# Patient Record
Sex: Female | Born: 1965
Health system: Southern US, Community
[De-identification: ages and names within clinical notes are randomized; demographics above are authoritative.]

## PROBLEM LIST (undated history)

## (undated) DIAGNOSIS — I4891 Unspecified atrial fibrillation: Secondary | ICD-10-CM

## (undated) DIAGNOSIS — T7840XA Allergy, unspecified, initial encounter: Secondary | ICD-10-CM

## (undated) DIAGNOSIS — Z808 Family history of malignant neoplasm of other organs or systems: Secondary | ICD-10-CM

## (undated) DIAGNOSIS — Z803 Family history of malignant neoplasm of breast: Secondary | ICD-10-CM

## (undated) DIAGNOSIS — E785 Hyperlipidemia, unspecified: Secondary | ICD-10-CM

## (undated) DIAGNOSIS — Z801 Family history of malignant neoplasm of trachea, bronchus and lung: Secondary | ICD-10-CM

## (undated) DIAGNOSIS — F419 Anxiety disorder, unspecified: Secondary | ICD-10-CM

## (undated) DIAGNOSIS — K219 Gastro-esophageal reflux disease without esophagitis: Secondary | ICD-10-CM

## (undated) HISTORY — DX: Hyperlipidemia, unspecified: E78.5

## (undated) HISTORY — DX: Family history of malignant neoplasm of other organs or systems: Z80.8

## (undated) HISTORY — PX: APPENDECTOMY: SHX54

## (undated) HISTORY — DX: Allergy, unspecified, initial encounter: T78.40XA

## (undated) HISTORY — DX: Gastro-esophageal reflux disease without esophagitis: K21.9

## (undated) HISTORY — DX: Family history of malignant neoplasm of breast: Z80.3

## (undated) HISTORY — PX: OTHER SURGICAL HISTORY: SHX169

## (undated) HISTORY — DX: Anxiety disorder, unspecified: F41.9

## (undated) HISTORY — PX: CHOLECYSTECTOMY: SHX55

## (undated) HISTORY — PX: WRIST SURGERY: SHX841

## (undated) HISTORY — DX: Family history of malignant neoplasm of trachea, bronchus and lung: Z80.1

## (undated) HISTORY — DX: Unspecified atrial fibrillation: I48.91

---

## 2003-07-14 ENCOUNTER — Other Ambulatory Visit: Admission: RE | Admit: 2003-07-14 | Discharge: 2003-07-14 | Payer: Self-pay | Admitting: Obstetrics and Gynecology

## 2003-09-15 ENCOUNTER — Inpatient Hospital Stay (HOSPITAL_COMMUNITY): Admission: EM | Admit: 2003-09-15 | Discharge: 2003-09-16 | Payer: Self-pay | Admitting: *Deleted

## 2004-01-27 ENCOUNTER — Inpatient Hospital Stay (HOSPITAL_COMMUNITY): Admission: RE | Admit: 2004-01-27 | Discharge: 2004-01-30 | Payer: Self-pay | Admitting: Obstetrics and Gynecology

## 2004-03-13 ENCOUNTER — Other Ambulatory Visit: Admission: RE | Admit: 2004-03-13 | Discharge: 2004-03-13 | Payer: Self-pay | Admitting: Obstetrics and Gynecology

## 2004-12-17 ENCOUNTER — Ambulatory Visit: Payer: Self-pay | Admitting: Family Medicine

## 2005-06-10 ENCOUNTER — Ambulatory Visit: Payer: Self-pay | Admitting: Family Medicine

## 2005-09-25 ENCOUNTER — Ambulatory Visit: Payer: Self-pay | Admitting: Family Medicine

## 2006-01-08 ENCOUNTER — Other Ambulatory Visit: Admission: RE | Admit: 2006-01-08 | Discharge: 2006-01-08 | Payer: Self-pay | Admitting: Obstetrics and Gynecology

## 2015-12-13 ENCOUNTER — Ambulatory Visit (INDEPENDENT_AMBULATORY_CARE_PROVIDER_SITE_OTHER): Payer: PRIVATE HEALTH INSURANCE | Admitting: Sports Medicine

## 2015-12-13 ENCOUNTER — Encounter: Payer: Self-pay | Admitting: Sports Medicine

## 2015-12-13 ENCOUNTER — Ambulatory Visit (INDEPENDENT_AMBULATORY_CARE_PROVIDER_SITE_OTHER): Payer: PRIVATE HEALTH INSURANCE

## 2015-12-13 DIAGNOSIS — M79673 Pain in unspecified foot: Secondary | ICD-10-CM

## 2015-12-13 DIAGNOSIS — M722 Plantar fascial fibromatosis: Secondary | ICD-10-CM | POA: Diagnosis not present

## 2015-12-13 MED ORDER — TRIAMCINOLONE ACETONIDE 10 MG/ML IJ SUSP: 10.0000 mg | Freq: Once | INTRAMUSCULAR | Status: DC

## 2015-12-13 MED ORDER — DICLOFENAC SODIUM 75 MG PO TBEC: 75.0000 mg | DELAYED_RELEASE_TABLET | Freq: Two times a day (BID) | ORAL | Status: DC

## 2015-12-13 NOTE — Progress Notes (Signed)
Patient ID: Brittany Jensen, female   DOB: 1966-06-25, 50 y.o.   MRN: [id number] Subjective: Brittany Jensen is a 50 y.o. female patient presents to office with complaint of heel pain on the left>right. Patient admits to post static dyskinesia for > 31months in duration. Patient has treated this problem with ice and ibuprofen with a little improvement. However states that on Sunday she worked flip-flops and inflamed the areas. 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 States that she desires to come here for her foot care. No other issues or concerns noted.   There are no active problems to display for this patient.   No current outpatient prescriptions on file prior to visit.   No current facility-administered medications on file prior to visit.    Allergies  Allergen Reactions  . Ancef [Cefazolin]   . Biaxin [Clarithromycin]   . Penicillins   . Wellbutrin [Bupropion]    Patient admits that she cannot take oral steroids because it gives her stomach upset. However, she has had Kenalog socks before in the past with no adverse reaction.   Objective: Physical Exam General: The patient is alert and oriented x3 in no acute distress.  Dermatology: Skin is warm, dry and supple bilateral lower extremities. Nails 1-10 are normal. There is no erythema, edema, no eccymosis, no open lesions present. Integument is otherwise unremarkable.  Vascular: Dorsalis Pedis pulse and Posterior Tibial pulse are 2/4 bilateral. Capillary fill time is immediate to all digits.  Neurological: Grossly intact to light touch with an achilles reflex of +2/5 and a negative Tinel's sign bilateral.  Musculoskeletal: Tenderness to palpation at the medial calcaneal tubercale and through the insertion of the plantar fascia on the left>right foot. No pain with compression of calcaneus  bilateral. No pain with tuning fork to calcaneus bilateral. No pain with calf compression bilateral. There is decreased Ankle joint range of motion bilateral. All other joints range of motion within normal limits bilateral. Strength 5/5 in all groups bilateral.   Xray, Right & Left foot:  Normal osseous mineralization. Joint spaces preserved. No fracture/dislocation/boney destruction. Mild hammertoe deformity and spur at the distal tuft hallux. + Significant posterior and inferior Calcaneal spur present with mild thickening of plantar fascia. No other soft tissue abnormalities or radiopaque foreign bodies.   Assessment and Plan: Problem List Items Addressed This Visit    None    Visit Diagnoses    Foot pain, unspecified laterality    -  Primary    Relevant Medications    triamcinolone acetonide (KENALOG) 10 MG/ML injection 10 mg    diclofenac (VOLTAREN) 75 MG EC tablet    Other Relevant Orders    DG Foot 2 Views Left    DG Foot 2 Views Right    Plantar fasciitis, bilateral        L>R    Relevant Medications    triamcinolone acetonide (KENALOG) 10 MG/ML injection 10 mg    diclofenac (VOLTAREN) 75 MG EC tablet      -Complete examination performed. Discussed with patient in detail the condition of plantar fasciitis, how this occurs and general treatment options. Explained both conservative and surgical treatments.  -After oral consent and aseptic prep, injected a mixture containing 1 ml of 2%  plain lidocaine, 1 ml 0.5% plain marcaine, 0.5 ml of kenalog 10 and 0.5 ml  of dexamethasone phosphate into left heel. Post-injection care discussed with patient.  -Rx Diclofenac -Recommended good supportive shoes and advised use of OTC insert. Explained to patient that if these orthoses work well, we will continue with these. If these do not improve her condition and  pain, we will consider custom molded orthoses. -Explained in detail the use of the fascial braces for both the left and right foot  which was dispensed at today's visit. -Explained and dispensed to patient daily stretching exercises. -Recommend patient to ice affected area 1-2x daily. -Patient to return to office in 3 weeks for follow up or sooner if problems or questions arise.  Landis Martins, DPM

## 2015-12-13 NOTE — Patient Instructions (Signed)

## 2015-12-13 NOTE — Progress Notes (Deleted)
   Subjective:    Patient ID: Brittany Jensen, female    DOB: 04/20/1966, 50 y.o.   MRN: [id number]  HPI    Review of Systems  Cardiovascular:       A-fib   All other systems reviewed and are negative.      Objective:   Physical Exam        Assessment & Plan:

## 2016-01-03 ENCOUNTER — Ambulatory Visit: Payer: PRIVATE HEALTH INSURANCE | Admitting: Sports Medicine

## 2016-10-17 ENCOUNTER — Other Ambulatory Visit: Payer: Self-pay | Admitting: Internal Medicine

## 2016-10-17 DIAGNOSIS — N644 Mastodynia: Secondary | ICD-10-CM

## 2017-04-27 DIAGNOSIS — I4891 Unspecified atrial fibrillation: Secondary | ICD-10-CM | POA: Diagnosis not present

## 2017-04-28 DIAGNOSIS — I4891 Unspecified atrial fibrillation: Secondary | ICD-10-CM | POA: Diagnosis not present

## 2017-05-02 ENCOUNTER — Ambulatory Visit (INDEPENDENT_AMBULATORY_CARE_PROVIDER_SITE_OTHER): Payer: Commercial Managed Care - PPO | Admitting: Cardiology

## 2017-05-02 ENCOUNTER — Encounter: Payer: Self-pay | Admitting: Cardiology

## 2017-05-02 VITALS — BP 128/82 | HR 95 | Ht 69.0 in | Wt 243.0 lb

## 2017-05-02 DIAGNOSIS — F17210 Nicotine dependence, cigarettes, uncomplicated: Secondary | ICD-10-CM | POA: Diagnosis not present

## 2017-05-02 DIAGNOSIS — I480 Paroxysmal atrial fibrillation: Secondary | ICD-10-CM | POA: Diagnosis not present

## 2017-05-02 HISTORY — DX: Nicotine dependence, cigarettes, uncomplicated: F17.210

## 2017-05-02 HISTORY — DX: Paroxysmal atrial fibrillation: I48.0

## 2017-05-02 MED ORDER — ASPIRIN EC 325 MG PO TBEC
325.0000 mg | DELAYED_RELEASE_TABLET | Freq: Every day | ORAL | 0 refills | Status: DC
Start: 2017-05-02 — End: 2017-06-02

## 2017-05-02 NOTE — Patient Instructions (Signed)
Medication Instructions:  Your physician has recommended you make the following change in your medication:   Start Aspirin 325 mg once daily Cut back Diltiazem (Cardizem) 30 mg to every 12 hours ( Twice Daily)     Labwork: None  Testing/Procedures: None   Follow-Up: Your physician recommends that you schedule a follow-up appointment in: Next week Thursday 23rd.    Any Other Special Instructions Will Be Listed Below (If Applicable).     If you need a refill on your cardiac medications before your next appointment, please call your pharmacy.

## 2017-05-02 NOTE — Progress Notes (Signed)
Cardiology Office Note:    Date:  05/02/2017   ID:  Brittany Jensen, DOB 1966/08/08, MRN [id number]  PCP:  Lowella Dandy, NP  Cardiologist:  Jenean Lindau, MD   Referring MD: Lowella Dandy, NP    ASSESSMENT:    1. Cigarette smoker   2. Paroxysmal atrial fibrillation (HCC)    PLAN:    In order of problems listed above:  1. I discussed with the patient atrial fibrillation, disease process. Management and therapy including rate and rhythm control, anticoagulation benefits and potential risks were discussed extensively with the patient. Patient had multiple questions which were answered to patient's satisfaction. Her chads score is 0 and she has been advised to take coated 325 mg of aspirin on a daily basis. 2. Her blood pressure is borderline and therefore I have asked her to cut down her Cardizem to 30 mg twice a day instead of 3 times a day. Hopefully it will help her blood pressure. I will asked her to keep herself well hydrated with salt and water. And she vocalized understanding. Sodium intake was increased in mild to moderate amounts. I also noted that her TSH was done at [facility] and was fine 3. Echocardiogram report was discussed with her at length. Her left atrial size is unremarkable. The left ventricular systolic function is preserved. 4. Weight reduction was stressed. I also counseled her against cigarette smoking and does so smoking explained and she verbalized understanding and she plans to quit. She will be seen in follow-up appointment next day or earlier if she has any concerns. She knows to go to the nearest emergency room for any concerning symptoms. Because of the fact that she feels weak when she exerts herself and the fact that and make modifications in her medication atorvastatin to keep off work until I see her next week. Total time for this evaluation was 40 minutes.   Medication Adjustments/Labs and Tests Ordered: Current medicines are reviewed at length with  the patient today.  Concerns regarding medicines are outlined above.  No orders of the defined types were placed in this encounter.  No orders of the defined types were placed in this encounter.    History of Present Illness:    Brittany Jensen is a 51 y.o. female who is being seen today for the evaluation of Paroxysmal atrial fibrillation at the request of Moon, Amy A, NP. Patient is a pleasant 51 year old female. She has past medical history of paroxysmal atrial fibrillation. She mentions to me that she is under significant stress because of her work and the fact that she has been under stress because her mother is in hospice. A few days ago patient noticed elevated heart rate and was not feeling well and went to [facility] and was admitted. I have reviewed the [facility] hospital records extensively. Subsequently she was treated for this and went into sinus rhythm. Since then she has done better. She however has some weakness on exertion. No chest pain orthopnea or PND. At the time of my evaluation she is alert awake oriented and in no distress. It appears that her blood pressure might be on the lower side at times because she is taking Cardizem.  History reviewed. No pertinent past medical history.  History reviewed. No pertinent surgical history.  Current Medications: Current Meds  Medication Sig  . ALPRAZolam (XANAX) 0.5 MG tablet Take 0.5 mg by mouth every 6 (six) hours as needed.  Marland Kitchen aspirin 81 MG chewable tablet  Chew 81 mg by mouth daily.  Marland Kitchen diltiazem (CARDIZEM) 30 MG tablet Take 30 mg by mouth 3 (three) times daily.  Marland Kitchen ibuprofen (ADVIL,MOTRIN) 800 MG tablet Take 800 mg by mouth every 8 (eight) hours as needed.  Marland Kitchen omeprazole (PRILOSEC) 40 MG capsule Take 40 mg by mouth daily.  . VENTOLIN HFA 108 (90 Base) MCG/ACT inhaler INHALE 2 (TWO) INHALATION EVERY FOUR-SIX HOURS AS NEEDED   Current Facility-Administered Medications for the 05/02/17 encounter (Office Visit) with Davide Risdon, Reita Cliche, MD  Medication  . triamcinolone acetonide (KENALOG) 10 MG/ML injection 10 mg     Allergies:   Ancef [cefazolin]; Biaxin [clarithromycin]; Penicillins; and Wellbutrin [bupropion]   Social History   Social History  . Marital status: Married    Spouse name: N/A  . Number of children: N/A  . Years of education: N/A   Social History Main Topics  . Smoking status: Current Every Day Smoker    Types: Cigarettes  . Smokeless tobacco: Never Used  . Alcohol use 0.0 oz/week     Comment: occ  . Drug use: No  . Sexual activity: Not Asked   Other Topics Concern  . None   Social History Narrative  . None     Family History: The patient's family history includes Atrial fibrillation in her father.  ROS:   Please see the history of present illness.    All other systems reviewed and are negative.  EKGs/Labs/Other Studies Reviewed:    The following studies were reviewed today: I reviewed the EKG and the [facility] hospital records extensively. Patient had multiple questions which were answered to her satisfaction.   Recent Labs: No results found for requested labs within last 8760 hours.  Recent Lipid Panel No results found for: CHOL, TRIG, HDL, CHOLHDL, VLDL, LDLCALC, LDLDIRECT  Physical Exam:    VS:  BP 128/82   Pulse 95   Ht 5\' 9"  (1.753 m)   Wt 243 lb (110.2 kg)   SpO2 99%   BMI 35.88 kg/m     Wt Readings from Last 3 Encounters:  05/02/17 243 lb (110.2 kg)     GEN: Patient is in no acute distress HEENT: Normal NECK: No JVD; No carotid bruits LYMPHATICS: No lymphadenopathy CARDIAC: S1 S2 regular, 2/6 systolic murmur at the apex. RESPIRATORY:  Clear to auscultation without rales, wheezing or rhonchi  ABDOMEN: Soft, non-tender, non-distended MUSCULOSKELETAL:  No edema; No deformity  SKIN: Warm and dry NEUROLOGIC:  Alert and oriented x 3 PSYCHIATRIC:  Normal affect    Signed, Jenean Lindau, MD  05/02/2017 10:50 AM    [place]

## 2017-05-08 ENCOUNTER — Ambulatory Visit (INDEPENDENT_AMBULATORY_CARE_PROVIDER_SITE_OTHER): Payer: Commercial Managed Care - PPO | Admitting: Cardiology

## 2017-05-08 ENCOUNTER — Encounter: Payer: Self-pay | Admitting: Cardiology

## 2017-05-08 VITALS — BP 130/78 | HR 75 | Ht 69.5 in | Wt 244.0 lb

## 2017-05-08 DIAGNOSIS — F17210 Nicotine dependence, cigarettes, uncomplicated: Secondary | ICD-10-CM

## 2017-05-08 DIAGNOSIS — I480 Paroxysmal atrial fibrillation: Secondary | ICD-10-CM | POA: Diagnosis not present

## 2017-05-08 NOTE — Progress Notes (Signed)
Cardiology Office Note:    Date:  05/08/2017   ID:  Brittany Jensen, DOB [birth year], MRN [id number]  PCP:  Lowella Dandy, NP  Cardiologist:  Jenean Lindau, MD   Referring MD: Lowella Dandy, NP    ASSESSMENT:    1. Paroxysmal atrial fibrillation (HCC)   2. Cigarette smoker    PLAN:    In order of problems listed above:  1. The patient is in stable sinus rhythm since last evaluation. She does not seem to be tolerating Cardizem too well. In view of this I would prefer to put her on a medication such as flecainide. I discussed this with her and extensive length and she is agreeable. For this she will undergo Lexiscan sestamibi at [place] in the next few days. If this is negative I we'll initiated him on flecainide 50 mg twice a day and follow her EKGs for the next few days. I plan to get back to work in the next few days after this evaluation and immunization. She localized understanding. She will be seen in follow-up appointment in 2 months or earlier if she has any concerns.   Medication Adjustments/Labs and Tests Ordered: Current medicines are reviewed at length with the patient today.  Concerns regarding medicines are outlined above.  Orders Placed This Encounter  Procedures  . Myocardial Perfusion Imaging   No orders of the defined types were placed in this encounter.    Chief Complaint  Patient presents with  . Follow-up    Medication Change      History of Present Illness:    Brittany Jensen is a 50 y.o. female. I recently evaluated her for paroxysmal atrial fibrillation. She is posthospitalization. I cut down her Cardizem dose and subsequently she is felt better but not completely fine. She also saw primary care physician recently and was initiated on antidepressant. She's been under a stressful situation with her mother being in hospice. She mentions to me that she feels better but not completely fine at this time. No chest pain orthopnea or PND. She has not had any  significant issues with palpitations at this time.  History reviewed. No pertinent past medical history.  History reviewed. No pertinent surgical history.  Current Medications: Current Meds  Medication Sig  . ALPRAZolam (XANAX) 0.5 MG tablet Take 0.5 mg by mouth every 6 (six) hours as needed.  Marland Kitchen aspirin EC 325 MG tablet Take 1 tablet (325 mg total) by mouth daily.  Marland Kitchen diltiazem (CARDIZEM) 30 MG tablet Take 30 mg by mouth every 12 (twelve) hours.  Marland Kitchen ibuprofen (ADVIL,MOTRIN) 800 MG tablet Take 800 mg by mouth every 8 (eight) hours as needed.  Marland Kitchen omeprazole (PRILOSEC) 40 MG capsule Take 40 mg by mouth daily.  Marland Kitchen venlafaxine (EFFEXOR) 75 MG tablet Take 75 mg by mouth 2 (two) times daily.  . VENTOLIN HFA 108 (90 Base) MCG/ACT inhaler INHALE 2 (TWO) INHALATION EVERY FOUR-SIX HOURS AS NEEDED   Current Facility-Administered Medications for the 05/08/17 encounter (Office Visit) with Luara Faye, Reita Cliche, MD  Medication  . triamcinolone acetonide (KENALOG) 10 MG/ML injection 10 mg     Allergies:   Ancef [cefazolin]; Biaxin [clarithromycin]; Penicillins; and Wellbutrin [bupropion]   Social History   Social History  . Marital status: Married    Spouse name: N/A  . Number of children: N/A  . Years of education: N/A   Social History Main Topics  . Smoking status: Current Every Day Smoker    Types: Cigarettes  .  Smokeless tobacco: Never Used  . Alcohol use 0.0 oz/week     Comment: occ  . Drug use: No  . Sexual activity: Not Asked   Other Topics Concern  . None   Social History Narrative  . None     Family History: The patient's family history includes Atrial fibrillation in her father.  ROS:   Please see the history of present illness.    All other systems reviewed and are negative.  EKGs/Labs/Other Studies Reviewed:    The following studies were reviewed today: I reviewed previous office notes and discussed this with her at length.   Recent Labs: No results found for  requested labs within last 8760 hours.  Recent Lipid Panel No results found for: CHOL, TRIG, HDL, CHOLHDL, VLDL, LDLCALC, LDLDIRECT  Physical Exam:    VS:  BP 130/78   Pulse 75   Ht 5' 9.5" (1.765 m)   Wt 244 lb 0.6 oz (110.7 kg)   SpO2 98%   BMI 35.52 kg/m     Wt Readings from Last 3 Encounters:  05/08/17 244 lb 0.6 oz (110.7 kg)  05/02/17 243 lb (110.2 kg)     GEN: Patient is in no acute distress HEENT: Normal NECK: No JVD; No carotid bruits LYMPHATICS: No lymphadenopathy CARDIAC: Hear sounds regular, 2/6 systolic murmur at the apex. RESPIRATORY:  Clear to auscultation without rales, wheezing or rhonchi  ABDOMEN: Soft, non-tender, non-distended MUSCULOSKELETAL:  No edema; No deformity  SKIN: Warm and dry NEUROLOGIC:  Alert and oriented x 3 PSYCHIATRIC:  Normal affect   Signed, Jenean Lindau, MD  05/08/2017 11:24 AM    [place]

## 2017-05-08 NOTE — Patient Instructions (Signed)
Medication Instructions:   Your physician recommends that you continue on your current medications as directed. Please refer to the Current Medication list given to you today.   Labwork: NONE  Testing/Procedures:  Your physician has requested that you have a lexiscan myoview. For further information please visit [email or web address]. Please follow instruction sheet, as given.    Follow-Up: Your physician recommends that you schedule a follow-up appointment in: 1 month with Dr. Geraldo Pitter.    Any Other Special Instructions Will Be Listed Below (If Applicable).     If you need a refill on your cardiac medications before your next appointment, please call your pharmacy.

## 2017-05-15 DIAGNOSIS — I480 Paroxysmal atrial fibrillation: Secondary | ICD-10-CM | POA: Diagnosis not present

## 2017-05-16 ENCOUNTER — Other Ambulatory Visit: Payer: Self-pay

## 2017-05-16 DIAGNOSIS — I480 Paroxysmal atrial fibrillation: Secondary | ICD-10-CM

## 2017-05-16 MED ORDER — FLECAINIDE ACETATE 50 MG PO TABS: 50.0000 mg | ORAL_TABLET | Freq: Two times a day (BID) | ORAL | 3 refills | Status: DC

## 2017-05-16 NOTE — Telephone Encounter (Signed)
Pt informed of results from [facility] and to start new medications Flecainide 50 mg BID and to start tomorrow. Also he would like for pt to have EKG and BMP done Tuesday of next week. Pt works with [employer] and is fine to have this done at work or could just with the results sent to Dr. Geraldo Pitter. Rx has been sent to pharmacy. Pt will stop Diltizem per RRR.

## 2017-05-20 ENCOUNTER — Ambulatory Visit (INDEPENDENT_AMBULATORY_CARE_PROVIDER_SITE_OTHER): Payer: Commercial Managed Care - PPO | Admitting: Cardiology

## 2017-05-20 DIAGNOSIS — I480 Paroxysmal atrial fibrillation: Secondary | ICD-10-CM | POA: Diagnosis not present

## 2017-05-20 NOTE — Progress Notes (Signed)
Pt Came in today for EKG and BMP. Dr. Geraldo Pitter has reviewed the EKG and states it looks fine. Pt has also been released to go back to work in Monday.

## 2017-05-21 LAB — BASIC METABOLIC PANEL
BUN / CREAT RATIO: 11 (ref 9–23)
BUN: 7 mg/dL (ref 6–24)
CO2: 25 mmol/L (ref 20–29)
CREATININE: 0.66 mg/dL (ref 0.57–1.00)
Calcium: 9.3 mg/dL (ref 8.7–10.2)
Chloride: 99 mmol/L (ref 96–106)
GFR, EST AFRICAN AMERICAN: 120 mL/min/{1.73_m2} (ref >59)
GFR, EST NON AFRICAN AMERICAN: 104 mL/min/{1.73_m2} (ref >59)
Glucose: 89 mg/dL (ref 65–99)
Potassium: 3.8 mmol/L (ref 3.5–5.2)
Sodium: 138 mmol/L (ref 134–144)

## 2017-05-27 ENCOUNTER — Other Ambulatory Visit: Payer: Self-pay

## 2017-05-27 ENCOUNTER — Telehealth: Payer: Self-pay | Admitting: Cardiology

## 2017-05-27 NOTE — Telephone Encounter (Signed)
Called Pharmacy and to why 90 days had not been dispensed to pt upon the refill request and Pharmacist stated that a mistake had been made and she would look into it to fix this. I advise her that if there is anything I could do to help to let us know. Brittany Jensen had been informed of this as well. I will check back with pharmacy to make sure this has been fixed.

## 2017-05-27 NOTE — Telephone Encounter (Signed)
Wants flecinide called to [facility] in [place] again because they only gave her quantity of 30 and she takes 2 a day

## 2017-06-02 ENCOUNTER — Other Ambulatory Visit: Payer: Self-pay | Admitting: Cardiology

## 2017-06-02 DIAGNOSIS — F17210 Nicotine dependence, cigarettes, uncomplicated: Secondary | ICD-10-CM

## 2017-06-02 DIAGNOSIS — I480 Paroxysmal atrial fibrillation: Secondary | ICD-10-CM

## 2017-06-03 MED ORDER — ASPIRIN EC 325 MG PO TBEC: 325.0000 mg | DELAYED_RELEASE_TABLET | Freq: Every day | ORAL | 3 refills | Status: DC

## 2017-06-09 ENCOUNTER — Ambulatory Visit: Payer: Commercial Managed Care - PPO | Admitting: Cardiology

## 2017-06-10 ENCOUNTER — Ambulatory Visit (INDEPENDENT_AMBULATORY_CARE_PROVIDER_SITE_OTHER): Payer: Commercial Managed Care - PPO | Admitting: Cardiology

## 2017-06-10 ENCOUNTER — Encounter: Payer: Self-pay | Admitting: Cardiology

## 2017-06-10 VITALS — BP 116/84 | HR 70 | Ht 69.0 in | Wt 245.8 lb

## 2017-06-10 DIAGNOSIS — F17210 Nicotine dependence, cigarettes, uncomplicated: Secondary | ICD-10-CM

## 2017-06-10 DIAGNOSIS — I480 Paroxysmal atrial fibrillation: Secondary | ICD-10-CM

## 2017-06-10 NOTE — Patient Instructions (Signed)
Medication Instructions:   Your physician recommends that you continue on your current medications as directed. Please refer to the Current Medication list given to you today.   Labwork:  NONE  Testing/Procedures:  NONE  Follow-Up:  Your physician recommends that you schedule a follow-up appointment in: 3 months with Dr. Geraldo Pitter   Any Other Special Instructions Will Be Listed Below (If Applicable).     If you need a refill on your cardiac medications before your next appointment, please call your pharmacy.

## 2017-06-11 NOTE — Progress Notes (Signed)
Cardiology Office Note:    Date:  06/11/2017   ID:  Brittany Jensen, DOB 1965-10-11, MRN [id number]  PCP:  Brittany Dandy, NP  Cardiologist:  Brittany Lindau, MD   Referring MD: Brittany Dandy, NP    ASSESSMENT:    1. Paroxysmal atrial fibrillation (HCC)   2. Cigarette smoker    PLAN:    In order of problems listed above:  1. Atrial fibrillation issues and rhythm issues are stable at this time. She is clinically in sinus rhythm. I encouraged her to keep her stress level at low especially with her situation at home and she verbalized understanding. She will continue current medications. She'll be seen in follow-up appointment in 3 months or earlier if she has any concerns. 2. I encouraged her again cut down her smoking and quit as soon as possible she said she is working on it.   Medication Adjustments/Labs and Tests Ordered: Current medicines are reviewed at length with the patient today.  Concerns regarding medicines are outlined above.  No orders of the defined types were placed in this encounter.  No orders of the defined types were placed in this encounter.    Chief Complaint  Patient presents with  . Follow-up     History of Present Illness:    Brittany Jensen is a 51 y.o. female . She recently has been diagnosed with approximately fibrillation. She denies any problems at this time and takes care of activities of daily living. No chest pain orthopnea or PND. Her mother is now in hospice and she is just about this. She's not had any palpitations or any such issues. At the time of my evaluation she is alert awake oriented and in no distress.  History reviewed. No pertinent past medical history.  History reviewed. No pertinent surgical history.  Current Medications: Current Meds  Medication Sig  . ALPRAZolam (XANAX) 0.5 MG tablet Take 0.5 mg by mouth every 6 (six) hours as needed.  Marland Kitchen aspirin EC 325 MG tablet Take 1 tablet (325 mg total) by mouth daily.  . flecainide  (TAMBOCOR) 50 MG tablet Take 1 tablet (50 mg total) by mouth 2 (two) times daily.  Marland Kitchen ibuprofen (ADVIL,MOTRIN) 800 MG tablet Take 800 mg by mouth every 8 (eight) hours as needed.  Marland Kitchen omeprazole (PRILOSEC) 40 MG capsule Take 40 mg by mouth daily.  Marland Kitchen venlafaxine (EFFEXOR) 75 MG tablet Take 75 mg by mouth 2 (two) times daily.  . VENTOLIN HFA 108 (90 Base) MCG/ACT inhaler INHALE 2 (TWO) INHALATION EVERY FOUR-SIX HOURS AS NEEDED   Current Facility-Administered Medications for the 06/10/17 encounter (Office Visit) with Brittany Jensen, Reita Cliche, MD  Medication  . triamcinolone acetonide (KENALOG) 10 MG/ML injection 10 mg     Allergies:   Ancef [cefazolin]; Biaxin [clarithromycin]; Penicillins; and Wellbutrin [bupropion]   Social History   Social History  . Marital status: Married    Spouse name: N/A  . Number of children: N/A  . Years of education: N/A   Social History Main Topics  . Smoking status: Current Every Day Smoker    Types: Cigarettes  . Smokeless tobacco: Never Used  . Alcohol use 0.0 oz/week     Comment: occ  . Drug use: No  . Sexual activity: Not Asked   Other Topics Concern  . None   Social History Narrative  . None     Family History: The patient's family history includes Atrial fibrillation in her father.  ROS:   Please  see the history of present illness.    All other systems reviewed and are negative.  EKGs/Labs/Other Studies Reviewed:    The following studies were reviewed today: Prior office visit records were reviewed and discussed with her.   Recent Labs: 05/20/2017: BUN 7; Creatinine, Ser 0.66; Potassium 3.8; Sodium 138  Recent Lipid Panel No results found for: CHOL, TRIG, HDL, CHOLHDL, VLDL, LDLCALC, LDLDIRECT  Physical Exam:    VS:  BP 116/84   Pulse 70   Ht 5\' 9"  (1.753 m)   Wt 245 lb 12.8 oz (111.5 kg)   SpO2 96%   BMI 36.30 kg/m     Wt Readings from Last 3 Encounters:  06/10/17 245 lb 12.8 oz (111.5 kg)  05/08/17 244 lb 0.6 oz (110.7 kg)    05/02/17 243 lb (110.2 kg)     GEN: Patient is in no acute distress HEENT: Normal NECK: No JVD; No carotid bruits LYMPHATICS: No lymphadenopathy CARDIAC: Hear sounds regular, 2/6 systolic murmur at the apex. RESPIRATORY:  Clear to auscultation without rales, wheezing or rhonchi  ABDOMEN: Soft, non-tender, non-distended MUSCULOSKELETAL:  No edema; No deformity  SKIN: Warm and dry NEUROLOGIC:  Alert and oriented x 3 PSYCHIATRIC:  Normal affect   Signed, Brittany Lindau, MD  06/11/2017 8:48 AM    [place]

## 2017-06-25 ENCOUNTER — Encounter: Payer: Self-pay | Admitting: Cardiology

## 2017-06-25 ENCOUNTER — Other Ambulatory Visit: Payer: Self-pay

## 2017-06-25 DIAGNOSIS — I480 Paroxysmal atrial fibrillation: Secondary | ICD-10-CM

## 2017-06-25 MED ORDER — FLECAINIDE ACETATE 50 MG PO TABS: 50.0000 mg | ORAL_TABLET | Freq: Two times a day (BID) | ORAL | 3 refills | Status: DC

## 2017-09-05 ENCOUNTER — Encounter: Payer: Self-pay | Admitting: Cardiology

## 2017-09-05 ENCOUNTER — Ambulatory Visit (INDEPENDENT_AMBULATORY_CARE_PROVIDER_SITE_OTHER): Payer: Commercial Managed Care - PPO | Admitting: Cardiology

## 2017-09-05 VITALS — BP 138/90 | HR 85 | Ht 69.0 in | Wt 254.0 lb

## 2017-09-05 DIAGNOSIS — I480 Paroxysmal atrial fibrillation: Secondary | ICD-10-CM | POA: Diagnosis not present

## 2017-09-05 DIAGNOSIS — F17210 Nicotine dependence, cigarettes, uncomplicated: Secondary | ICD-10-CM | POA: Diagnosis not present

## 2017-09-05 NOTE — Progress Notes (Signed)
Cardiology Office Note:    Date:  09/05/2017   ID:  Brittany Jensen, DOB March 02, 1966, MRN [id number]  PCP:  Brittany Dandy, NP  Cardiologist:  Jenean Lindau, MD   Referring MD: Brittany Dandy, NP    ASSESSMENT:    1. Paroxysmal atrial fibrillation (HCC)   2. Cigarette smoker    PLAN:    In order of problems listed above:  1. I discussed with the patient atrial fibrillation, disease process. Management and therapy including rate and rhythm control, anticoagulation benefits and potential risks were discussed extensively with the patient. Patient had multiple questions which were answered to patient's satisfaction.  Patient's chads vasc score is low and she will continue full-strength coated aspirin.  I counseled her against smoking and she vocalized understanding plans to quit.  Importance of regular exercise and weight reduction with diet and exercise was stressed.  She will be seen in follow-up appointment in August or earlier if she has any concerns.   Medication Adjustments/Labs and Tests Ordered: Current medicines are reviewed at length with the patient today.  Concerns regarding medicines are outlined above.  No orders of the defined types were placed in this encounter.  No orders of the defined types were placed in this encounter.    Chief Complaint  Patient presents with  . Follow-up     History of Present Illness:    Brittany Jensen is a 51 y.o. female.  The patient has paroxysmal atrial fibrillation and is doing well on flecainide.  He denies any problems at this time no chest pain orthopnea or PND.  She leads a sedentary lifestyle because of orthopedic issues with her back.  Unfortunately she continues to smoke.  At the time of my evaluation, the patient is alert awake oriented and in no distress.  History reviewed. No pertinent past medical history.  History reviewed. No pertinent surgical history.  Current Medications: Current Meds  Medication Sig  . ALPRAZolam  (XANAX) 0.5 MG tablet Take 0.5 mg by mouth every 6 (six) hours as needed.  Marland Kitchen aspirin EC 325 MG tablet Take 1 tablet (325 mg total) by mouth daily.  . flecainide (TAMBOCOR) 50 MG tablet Take 1 tablet (50 mg total) by mouth 2 (two) times daily.  Marland Kitchen ibuprofen (ADVIL,MOTRIN) 800 MG tablet Take 800 mg by mouth every 8 (eight) hours as needed.  Marland Kitchen omeprazole (PRILOSEC) 40 MG capsule Take 40 mg by mouth daily.   Current Facility-Administered Medications for the 09/05/17 encounter (Office Visit) with Kymberlyn Eckford, Reita Cliche, MD  Medication  . triamcinolone acetonide (KENALOG) 10 MG/ML injection 10 mg     Allergies:   Ancef [cefazolin]; Biaxin [clarithromycin]; Penicillins; and Wellbutrin [bupropion]   Social History   Socioeconomic History  . Marital status: Married    Spouse name: None  . Number of children: None  . Years of education: None  . Highest education level: None  Social Needs  . Financial resource strain: None  . Food insecurity - worry: None  . Food insecurity - inability: None  . Transportation needs - medical: None  . Transportation needs - non-medical: None  Occupational History  . None  Tobacco Use  . Smoking status: Current Every Day Smoker    Types: Cigarettes  . Smokeless tobacco: Never Used  Substance and Sexual Activity  . Alcohol use: Yes    Alcohol/week: 0.0 oz    Comment: occ  . Drug use: No  . Sexual activity: None  Other Topics Concern  .  None  Social History Narrative  . None     Family History: The patient's family history includes Atrial fibrillation in her father.  ROS:   Please see the history of present illness.    All other systems reviewed and are negative.  EKGs/Labs/Other Studies Reviewed:    The following studies were reviewed today: Discussed my findings with the patient at extensive length.  EKG done today reveals sinus rhythm and nonspecific ST changes.   Recent Labs: 05/20/2017: BUN 7; Creatinine, Ser 0.66; Potassium 3.8; Sodium 138    Recent Lipid Panel No results found for: CHOL, TRIG, HDL, CHOLHDL, VLDL, LDLCALC, LDLDIRECT  Physical Exam:    VS:  BP 138/90 (BP Location: Left Arm, Patient Position: Sitting, Cuff Size: Normal)   Pulse 85   Ht 5\' 9"  (1.753 m)   Wt 254 lb (115.2 kg)   SpO2 98%   BMI 37.51 kg/m     Wt Readings from Last 3 Encounters:  09/05/17 254 lb (115.2 kg)  06/10/17 245 lb 12.8 oz (111.5 kg)  05/08/17 244 lb 0.6 oz (110.7 kg)     GEN: Patient is in no acute distress HEENT: Normal NECK: No JVD; No carotid bruits LYMPHATICS: No lymphadenopathy CARDIAC: Hear sounds regular, 2/6 systolic murmur at the apex. RESPIRATORY:  Clear to auscultation without rales, wheezing or rhonchi  ABDOMEN: Soft, non-tender, non-distended MUSCULOSKELETAL:  No edema; No deformity  SKIN: Warm and dry NEUROLOGIC:  Alert and oriented x 3 PSYCHIATRIC:  Normal affect   Signed, Jenean Lindau, MD  09/05/2017 4:02 PM     [facility]

## 2017-09-05 NOTE — Patient Instructions (Signed)
Medication Instructions:  Your physician recommends that you continue on your current medications as directed. Please refer to the Current Medication list given to you today.  Labwork: None ordered  Testing/Procedures: None ordered  Follow-Up: Your physician recommends that you schedule a follow-up appointment in: 7 months with Dr. Agustin Cree  Any Other Special Instructions Will Be Listed Below (If Applicable).     If you need a refill on your cardiac medications before your next appointment, please call your pharmacy.

## 2017-09-10 ENCOUNTER — Other Ambulatory Visit (INDEPENDENT_AMBULATORY_CARE_PROVIDER_SITE_OTHER): Payer: Commercial Managed Care - PPO

## 2017-09-10 DIAGNOSIS — I480 Paroxysmal atrial fibrillation: Secondary | ICD-10-CM

## 2018-03-04 DIAGNOSIS — Z719 Counseling, unspecified: Secondary | ICD-10-CM | POA: Diagnosis not present

## 2018-04-01 DIAGNOSIS — Z719 Counseling, unspecified: Secondary | ICD-10-CM | POA: Diagnosis not present

## 2018-04-08 DIAGNOSIS — Z719 Counseling, unspecified: Secondary | ICD-10-CM | POA: Diagnosis not present

## 2018-05-08 DIAGNOSIS — Z1231 Encounter for screening mammogram for malignant neoplasm of breast: Secondary | ICD-10-CM | POA: Diagnosis not present

## 2018-05-26 ENCOUNTER — Other Ambulatory Visit: Payer: Self-pay | Admitting: Cardiology

## 2018-05-26 DIAGNOSIS — I480 Paroxysmal atrial fibrillation: Secondary | ICD-10-CM

## 2018-07-10 DIAGNOSIS — M7731 Calcaneal spur, right foot: Secondary | ICD-10-CM | POA: Diagnosis not present

## 2018-07-10 DIAGNOSIS — M79672 Pain in left foot: Secondary | ICD-10-CM | POA: Diagnosis not present

## 2018-07-10 DIAGNOSIS — M79671 Pain in right foot: Secondary | ICD-10-CM | POA: Diagnosis not present

## 2018-07-10 DIAGNOSIS — M71571 Other bursitis, not elsewhere classified, right ankle and foot: Secondary | ICD-10-CM | POA: Diagnosis not present

## 2018-07-10 DIAGNOSIS — M722 Plantar fascial fibromatosis: Secondary | ICD-10-CM | POA: Diagnosis not present

## 2018-08-28 ENCOUNTER — Other Ambulatory Visit: Payer: Self-pay | Admitting: *Deleted

## 2018-08-28 DIAGNOSIS — I480 Paroxysmal atrial fibrillation: Secondary | ICD-10-CM

## 2018-08-28 MED ORDER — FLECAINIDE ACETATE 50 MG PO TABS: 50.0000 mg | ORAL_TABLET | Freq: Two times a day (BID) | ORAL | 0 refills | Status: DC

## 2018-09-25 ENCOUNTER — Ambulatory Visit (INDEPENDENT_AMBULATORY_CARE_PROVIDER_SITE_OTHER): Payer: BLUE CROSS/BLUE SHIELD | Admitting: Cardiology

## 2018-09-25 ENCOUNTER — Encounter: Payer: Self-pay | Admitting: Cardiology

## 2018-09-25 VITALS — BP 122/70 | HR 75 | Ht 69.0 in | Wt 246.0 lb

## 2018-09-25 DIAGNOSIS — F17210 Nicotine dependence, cigarettes, uncomplicated: Secondary | ICD-10-CM

## 2018-09-25 DIAGNOSIS — I480 Paroxysmal atrial fibrillation: Secondary | ICD-10-CM

## 2018-09-25 MED ORDER — FLECAINIDE ACETATE 50 MG PO TABS: 50.0000 mg | ORAL_TABLET | Freq: Two times a day (BID) | ORAL | 1 refills | Status: DC

## 2018-09-25 NOTE — Progress Notes (Signed)
Cardiology Office Note:    Date:  09/25/2018   ID:  Brittany Jensen, DOB 1965/12/08, MRN [id number]  PCP:  Lowella Dandy, NP  Cardiologist:  Jenean Lindau, MD   Referring MD: Lowella Dandy, NP    ASSESSMENT:    1. Paroxysmal atrial fibrillation (HCC)   2. Cigarette smoker    PLAN:    In order of problems listed above:  1. Primary prevention stressed with the patient.  Importance of compliance with diet and medication stressed and she vocalized understanding.  Her blood pressure is stable.  Diet was discussed for dyslipidemia and obesity and risks of obesity explained and she vocalized understanding.  She is compliant with her medications and taking them regularly.  Importance of regular exercise stressed.Patient will be seen in follow-up appointment in 6 months or earlier if the patient has any concerns.  I cautioned her against smoking and she tells me that she is in the process of quitting.  Risks were discussed.   Medication Adjustments/Labs and Tests Ordered: Current medicines are reviewed at length with the patient today.  Concerns regarding medicines are outlined above.  No orders of the defined types were placed in this encounter.  No orders of the defined types were placed in this encounter.    No chief complaint on file.    History of Present Illness:    Brittany Jensen is a 53 y.o. female.  Patient has past medical history of paroxysmal atrial fibrillation.  She denies any problems at this time and takes care of activities of daily living.  No chest pain orthopnea or PND.  She leads a sedentary lifestyle and continues to smoke.  She has not had any palpitations or any such issues and is happy with her health at this time.  History reviewed. No pertinent past medical history.  History reviewed. No pertinent surgical history.  Current Medications: Current Meds  Medication Sig  . ALPRAZolam (XANAX) 0.5 MG tablet Take 0.5 mg by mouth every 6 (six) hours as needed.  .  flecainide (TAMBOCOR) 50 MG tablet Take 1 tablet (50 mg total) by mouth 2 (two) times daily.  Marland Kitchen ibuprofen (ADVIL,MOTRIN) 800 MG tablet Take 800 mg by mouth every 8 (eight) hours as needed.  Marland Kitchen omeprazole (PRILOSEC) 40 MG capsule Take 40 mg by mouth daily.   Current Facility-Administered Medications for the 09/25/18 encounter (Office Visit) with , Reita Cliche, MD  Medication  . triamcinolone acetonide (KENALOG) 10 MG/ML injection 10 mg     Allergies:   Ancef [cefazolin]; Biaxin [clarithromycin]; Penicillins; and Wellbutrin [bupropion]   Social History   Socioeconomic History  . Marital status: Married    Spouse name: Not on file  . Number of children: Not on file  . Years of education: Not on file  . Highest education level: Not on file  Occupational History  . Not on file  Social Needs  . Financial resource strain: Not on file  . Food insecurity:    Worry: Not on file    Inability: Not on file  . Transportation needs:    Medical: Not on file    Non-medical: Not on file  Tobacco Use  . Smoking status: Current Every Day Smoker    Types: Cigarettes  . Smokeless tobacco: Never Used  Substance and Sexual Activity  . Alcohol use: Yes    Alcohol/week: 0.0 standard drinks    Comment: occ  . Drug use: No  . Sexual activity: Not on file  Lifestyle  . Physical activity:    Days per week: Not on file    Minutes per session: Not on file  . Stress: Not on file  Relationships  . Social connections:    Talks on phone: Not on file    Gets together: Not on file    Attends religious service: Not on file    Active member of club or organization: Not on file    Attends meetings of clubs or organizations: Not on file    Relationship status: Not on file  Other Topics Concern  . Not on file  Social History Narrative  . Not on file     Family History: The patient's family history includes Atrial fibrillation in her father.  ROS:   Please see the history of present illness.      All other systems reviewed and are negative.  EKGs/Labs/Other Studies Reviewed:    The following studies were reviewed today: EKG reveals sinus rhythm and nonspecific ST-T changes   Recent Labs: No results found for requested labs within last 8760 hours.  Recent Lipid Panel No results found for: CHOL, TRIG, HDL, CHOLHDL, VLDL, LDLCALC, LDLDIRECT  Physical Exam:    VS:  BP 122/70 (BP Location: Right Arm, Patient Position: Sitting, Cuff Size: Normal)   Pulse 75   Ht 5\' 9"  (1.753 m)   Wt 246 lb (111.6 kg)   SpO2 98%   BMI 36.33 kg/m     Wt Readings from Last 3 Encounters:  09/25/18 246 lb (111.6 kg)  09/05/17 254 lb (115.2 kg)  06/10/17 245 lb 12.8 oz (111.5 kg)     GEN: Patient is in no acute distress HEENT: Normal NECK: No JVD; No carotid bruits LYMPHATICS: No lymphadenopathy CARDIAC: Hear sounds regular, 2/6 systolic murmur at the apex. RESPIRATORY:  Clear to auscultation without rales, wheezing or rhonchi  ABDOMEN: Soft, non-tender, non-distended MUSCULOSKELETAL:  No edema; No deformity  SKIN: Warm and dry NEUROLOGIC:  Alert and oriented x 3 PSYCHIATRIC:  Normal affect   Signed, Jenean Lindau, MD  09/25/2018 2:29 PM    [place]

## 2018-09-25 NOTE — Patient Instructions (Signed)
Medication Instructions:  Your physician recommends that you continue on your current medications as directed. Please refer to the Current Medication list given to you today.  If you need a refill on your cardiac medications before your next appointment, please call your pharmacy.   Lab work: None.  If you have labs (blood work) drawn today and your tests are completely normal, you will receive your results only by: Marland Kitchen MyChart Message (if you have MyChart) OR . A paper copy in the mail If you have any lab test that is abnormal or we need to change your treatment, we will call you to review the results.  Testing/Procedures: None.   Follow-Up: At [facility], you and your health needs are our priority.  As part of our continuing mission to provide you with exceptional heart care, we have created designated Provider Care Teams.  These Care Teams include your primary Cardiologist (physician) and Advanced Practice Providers (APPs -  Physician Assistants and Nurse Practitioners) who all work together to provide you with the care you need, when you need it. You will need a follow up appointment in 9 months.  Please call our office 2 months in advance to schedule this appointment.  You may see No primary care provider on file. or another member of our [employer] in [place]: Jenne Campus, MD . Shirlee More, MD  Any Other Special Instructions Will Be Listed Below (If Applicable).

## 2018-11-17 DIAGNOSIS — J209 Acute bronchitis, unspecified: Secondary | ICD-10-CM | POA: Diagnosis not present

## 2018-11-17 DIAGNOSIS — Z6835 Body mass index (BMI) 35.0-35.9, adult: Secondary | ICD-10-CM | POA: Diagnosis not present

## 2018-11-20 DIAGNOSIS — E669 Obesity, unspecified: Secondary | ICD-10-CM | POA: Diagnosis not present

## 2018-11-20 DIAGNOSIS — Z0000 Encounter for general adult medical examination without abnormal findings: Secondary | ICD-10-CM | POA: Diagnosis not present

## 2018-11-20 DIAGNOSIS — E559 Vitamin D deficiency, unspecified: Secondary | ICD-10-CM | POA: Diagnosis not present

## 2018-11-20 DIAGNOSIS — Z720 Tobacco use: Secondary | ICD-10-CM | POA: Diagnosis not present

## 2018-11-25 ENCOUNTER — Encounter: Payer: Self-pay | Admitting: Gastroenterology

## 2019-01-01 ENCOUNTER — Encounter: Payer: BLUE CROSS/BLUE SHIELD | Admitting: Gastroenterology

## 2019-03-22 ENCOUNTER — Other Ambulatory Visit: Payer: Self-pay | Admitting: Cardiology

## 2019-03-22 DIAGNOSIS — I480 Paroxysmal atrial fibrillation: Secondary | ICD-10-CM

## 2019-03-24 NOTE — Telephone Encounter (Signed)
Flecainide refill sent to [facility] in [place]

## 2019-03-26 ENCOUNTER — Other Ambulatory Visit: Payer: Self-pay

## 2019-03-26 ENCOUNTER — Telehealth: Payer: BLUE CROSS/BLUE SHIELD | Admitting: Cardiology

## 2019-03-26 ENCOUNTER — Encounter: Payer: Self-pay | Admitting: Cardiology

## 2019-03-26 ENCOUNTER — Telehealth (INDEPENDENT_AMBULATORY_CARE_PROVIDER_SITE_OTHER): Payer: BC Managed Care – PPO | Admitting: Cardiology

## 2019-03-26 VITALS — Ht 69.0 in | Wt 240.0 lb

## 2019-03-26 DIAGNOSIS — I480 Paroxysmal atrial fibrillation: Secondary | ICD-10-CM

## 2019-03-26 DIAGNOSIS — F17210 Nicotine dependence, cigarettes, uncomplicated: Secondary | ICD-10-CM

## 2019-03-26 NOTE — Progress Notes (Signed)
Virtual Visit via Video Note   This visit type was conducted due to national recommendations for restrictions regarding the COVID-19 Pandemic (e.g. social distancing) in an effort to limit this patient's exposure and mitigate transmission in our community.  Due to her co-morbid illnesses, this patient is at least at moderate risk for complications without adequate follow up.  This format is felt to be most appropriate for this patient at this time.  All issues noted in this document were discussed and addressed.  A limited physical exam was performed with this format.  Please refer to  chart for her consent to telehealth for [facility].   Date:  03/26/2019   ID:  Brittany Jensen, DOB [birth year], MRN [id number]  Patient Location: Home Provider Location: Office  PCP:  Lowella Dandy, NP  Cardiologist:  No primary care provider on file.  Electrophysiologist:  None   Evaluation Performed:  Follow-Up Visit  Chief Complaint: Paroxysmal atrial fibrillation  History of Present Illness:    Brittany Jensen is a 53 y.o. female with past medical history of paroxysmal atrial fibrillation.  She denies any problems at this time and takes care of activities of daily living.  No chest pain orthopnea or PND.  She has not had any palpitations or any such issues.  At the time of my evaluation, the patient is alert awake oriented and in no distress.  The patient does not have symptoms concerning for COVID-19 infection (fever, chills, cough, or new shortness of breath).    History reviewed. No pertinent past medical history. History reviewed. No pertinent surgical history.   Current Meds  Medication Sig  . ALPRAZolam (XANAX) 0.5 MG tablet Take 0.5 mg by mouth every 6 (six) hours as needed.  . flecainide (TAMBOCOR) 50 MG tablet TAKE 1 TABLET (50 MG TOTAL) BY MOUTH 2 (TWO) TIMES DAILY.  Marland Kitchen ibuprofen (ADVIL,MOTRIN) 800 MG tablet Take 800 mg by mouth every 8 (eight) hours as needed.  .  montelukast (SINGULAIR) 10 MG tablet Take 1 tablet by mouth daily.  Marland Kitchen omeprazole (PRILOSEC) 40 MG capsule Take 40 mg by mouth daily.   Current Facility-Administered Medications for the 03/26/19 encounter (Telemedicine) with Revankar, Reita Cliche, MD  Medication  . triamcinolone acetonide (KENALOG) 10 MG/ML injection 10 mg     Allergies:   Ancef [cefazolin], Biaxin [clarithromycin], Penicillins, and Wellbutrin [bupropion]   Social History   Tobacco Use  . Smoking status: Current Every Day Smoker    Types: Cigarettes  . Smokeless tobacco: Never Used  Substance Use Topics  . Alcohol use: Yes    Alcohol/week: 0.0 standard drinks    Comment: occ  . Drug use: No     Family Hx: The patient's family history includes Atrial fibrillation in her father.  ROS:   Please see the history of present illness.    As mentioned above All other systems reviewed and are negative.   Prior CV studies:   The following studies were reviewed today:  None  Labs/Other Tests and Data Reviewed:    EKG:  No ECG reviewed.  Recent Labs: No results found for requested labs within last 8760 hours.   Recent Lipid Panel No results found for: CHOL, TRIG, HDL, CHOLHDL, LDLCALC, LDLDIRECT  Wt Readings from Last 3 Encounters:  03/26/19 240 lb (108.9 kg)  09/25/18 246 lb (111.6 kg)  09/05/17 254 lb (115.2 kg)     Objective:    Vital Signs:  Ht 5\' 9"  (1.753 m)  Wt 240 lb (108.9 kg)   BMI 35.44 kg/m    VITAL SIGNS:  reviewed  ASSESSMENT & PLAN:    1. Paroxysmal atrial fibrillation: Rhythm is well controlled with flecainide.  She is very happy about it.  She mentions to me that she is now exercising significantly in her own swimming pool at home.  She is trying to lose weight.  I told her that she will have to come back in 2 weeks from now when I am in the office here for blood work in the morning which will be Chem-7 CBC TSH and liver lipid check.  At that time she will also get an EKG. 2. Cigarette  smoking: I encouraged her to quit smoking and she tells me that she has got patches from her primary care physician and she will start using it and quit.  Risks of smoking explained she vocalized understanding 3. Patient will be seen in follow-up appointment in 6 months or earlier if the patient has any concerns   COVID-19 Education: The signs and symptoms of COVID-19 were discussed with the patient and how to seek care for testing (follow up with PCP or arrange E-visit).  The importance of social distancing was discussed today.  Time:   Today, I have spent 15 minutes with the patient with telehealth technology discussing the above problems.     Medication Adjustments/Labs and Tests Ordered: Current medicines are reviewed at length with the patient today.  Concerns regarding medicines are outlined above.   Tests Ordered: No orders of the defined types were placed in this encounter.   Medication Changes: No orders of the defined types were placed in this encounter.   Follow Up:  Virtual Visit or In Person in 6 month(s)  Signed, Jenean Lindau, MD  03/26/2019 2:49 PM    [place]

## 2019-03-26 NOTE — Patient Instructions (Signed)
Medication Instructions:  Your physician recommends that you continue on your current medications as directed. Please refer to the Current Medication list given to you today.  If you need a refill on your cardiac medications before your next appointment, please call your pharmacy.   Lab work: Your physician recommends that you return for lab work in: 2 weeks BMP,CBC,TSH,LIVER,LIPID  If you have labs (blood work) drawn today and your tests are completely normal, you will receive your results only by: Marland Kitchen MyChart Message (if you have MyChart) OR . A paper copy in the mail If you have any lab test that is abnormal or we need to change your treatment, we will call you to review the results.  Testing/Procedures: None  Follow-Up: At [facility], you and your health needs are our priority.  As part of our continuing mission to provide you with exceptional heart care, we have created designated Provider Care Teams.  These Care Teams include your primary Cardiologist (physician) and Advanced Practice Providers (APPs -  Physician Assistants and Nurse Practitioners) who all work together to provide you with the care you need, when you need it. You will need a follow up appointment in 2 weeks.  Any Other Special Instructions Will Be Listed Below (If Applicable).

## 2019-03-26 NOTE — Addendum Note (Signed)
Addended by: Particia Nearing B on: 03/26/2019 03:13 PM   Modules accepted: Orders

## 2019-04-08 ENCOUNTER — Ambulatory Visit (INDEPENDENT_AMBULATORY_CARE_PROVIDER_SITE_OTHER): Payer: BC Managed Care – PPO | Admitting: Cardiology

## 2019-04-08 ENCOUNTER — Other Ambulatory Visit: Payer: Self-pay

## 2019-04-08 DIAGNOSIS — Z13220 Encounter for screening for lipoid disorders: Secondary | ICD-10-CM | POA: Diagnosis not present

## 2019-04-08 DIAGNOSIS — Z1329 Encounter for screening for other suspected endocrine disorder: Secondary | ICD-10-CM | POA: Diagnosis not present

## 2019-04-08 DIAGNOSIS — I480 Paroxysmal atrial fibrillation: Secondary | ICD-10-CM | POA: Diagnosis not present

## 2019-04-08 DIAGNOSIS — F17210 Nicotine dependence, cigarettes, uncomplicated: Secondary | ICD-10-CM | POA: Diagnosis not present

## 2019-04-08 MED ORDER — VARENICLINE TARTRATE 0.5 MG PO TABS: 0.5000 mg | ORAL_TABLET | Freq: Two times a day (BID) | ORAL | 2 refills | Status: DC

## 2019-04-08 NOTE — Progress Notes (Signed)
Patient came in for an EKG today.  EKG reveals sinus rhythm and was within normal limits.  Patient had questions which were answered to her satisfaction.  She unfortunately continues to smoke and I told her that she needs to quit.  She is walking 2 miles every day.  We will prescribe Chantix at her request.  We will make sure that there is no significant interaction with flecainide.  She had blood work and we will keep her posted about the results.

## 2019-04-09 ENCOUNTER — Telehealth: Payer: Self-pay

## 2019-04-09 ENCOUNTER — Telehealth: Payer: Self-pay | Admitting: Cardiology

## 2019-04-09 LAB — CBC
Hematocrit: 46.8 % — ABNORMAL HIGH (ref 34.0–46.6)
Hemoglobin: 15.8 g/dL (ref 11.1–15.9)
MCH: 31.5 pg (ref 26.6–33.0)
MCHC: 33.8 g/dL (ref 31.5–35.7)
MCV: 93 fL (ref 79–97)
Platelets: 254 10*3/uL (ref 150–450)
RBC: 5.01 x10E6/uL (ref 3.77–5.28)
RDW: 12.8 % (ref 11.7–15.4)
WBC: 7.1 10*3/uL (ref 3.4–10.8)

## 2019-04-09 LAB — BASIC METABOLIC PANEL
BUN/Creatinine Ratio: 12 (ref 9–23)
BUN: 9 mg/dL (ref 6–24)
CO2: 22 mmol/L (ref 20–29)
Calcium: 9.5 mg/dL (ref 8.7–10.2)
Chloride: 99 mmol/L (ref 96–106)
Creatinine, Ser: 0.76 mg/dL (ref 0.57–1.00)
GFR calc Af Amer: 105 mL/min/{1.73_m2} (ref >59)
GFR calc non Af Amer: 91 mL/min/{1.73_m2} (ref >59)
Glucose: 97 mg/dL (ref 65–99)
Potassium: 4.7 mmol/L (ref 3.5–5.2)
Sodium: 137 mmol/L (ref 134–144)

## 2019-04-09 LAB — HEPATIC FUNCTION PANEL
ALT: 20 IU/L (ref 0–32)
AST: 20 IU/L (ref 0–40)
Albumin: 4.7 g/dL (ref 3.8–4.9)
Alkaline Phosphatase: 73 IU/L (ref 39–117)
Bilirubin Total: 0.3 mg/dL (ref 0.0–1.2)
Bilirubin, Direct: 0.08 mg/dL (ref 0.00–0.40)
Total Protein: 6.8 g/dL (ref 6.0–8.5)

## 2019-04-09 LAB — TSH: TSH: 1.03 u[IU]/mL (ref 0.450–4.500)

## 2019-04-09 LAB — LIPID PANEL
Chol/HDL Ratio: 4.5 ratio — ABNORMAL HIGH (ref 0.0–4.4)
Cholesterol, Total: 207 mg/dL — ABNORMAL HIGH (ref 100–199)
HDL: 46 mg/dL (ref >39)
LDL Calculated: 136 mg/dL — ABNORMAL HIGH (ref 0–99)
Triglycerides: 125 mg/dL (ref 0–149)
VLDL Cholesterol Cal: 25 mg/dL (ref 5–40)

## 2019-04-09 NOTE — Telephone Encounter (Signed)
-----   Message from Jenean Lindau, MD sent at 04/09/2019  8:03 AM EDT ----- Labs are generally fine except markedly elevated LDL.  She needs to diet and exercise better recheck in 4 months stop smoking Jenean Lindau, MD 04/09/2019 8:03 AM

## 2019-04-09 NOTE — Telephone Encounter (Signed)
Left message for patient to call back for results.  

## 2019-04-09 NOTE — Telephone Encounter (Signed)
Information relayed to patient, no further questions at this time. Repeat labs ordered. Copy sent to Dr. Manson Allan per patient request.

## 2019-04-09 NOTE — Telephone Encounter (Signed)
Returning call.

## 2019-04-09 NOTE — Telephone Encounter (Signed)
Information relayed to patient, copy sen to Dr. Manson Allan.

## 2019-06-25 DIAGNOSIS — F411 Generalized anxiety disorder: Secondary | ICD-10-CM | POA: Diagnosis not present

## 2019-06-25 DIAGNOSIS — E559 Vitamin D deficiency, unspecified: Secondary | ICD-10-CM | POA: Diagnosis not present

## 2019-06-25 DIAGNOSIS — I10 Essential (primary) hypertension: Secondary | ICD-10-CM | POA: Diagnosis not present

## 2019-06-25 DIAGNOSIS — I480 Paroxysmal atrial fibrillation: Secondary | ICD-10-CM | POA: Diagnosis not present

## 2019-08-06 DIAGNOSIS — Z1231 Encounter for screening mammogram for malignant neoplasm of breast: Secondary | ICD-10-CM | POA: Diagnosis not present

## 2019-09-21 ENCOUNTER — Ambulatory Visit: Payer: BC Managed Care – PPO | Admitting: Cardiology

## 2019-10-11 ENCOUNTER — Other Ambulatory Visit: Payer: Self-pay | Admitting: Cardiology

## 2019-10-11 DIAGNOSIS — I480 Paroxysmal atrial fibrillation: Secondary | ICD-10-CM

## 2019-10-13 ENCOUNTER — Ambulatory Visit: Payer: BC Managed Care – PPO | Admitting: Cardiology

## 2019-10-18 ENCOUNTER — Ambulatory Visit: Payer: BC Managed Care – PPO | Admitting: Cardiology

## 2019-10-18 ENCOUNTER — Other Ambulatory Visit: Payer: Self-pay

## 2019-10-18 ENCOUNTER — Encounter: Payer: Self-pay | Admitting: Cardiology

## 2019-10-18 VITALS — BP 122/80 | HR 89 | Ht 69.0 in | Wt 256.0 lb

## 2019-10-18 DIAGNOSIS — I480 Paroxysmal atrial fibrillation: Secondary | ICD-10-CM

## 2019-10-18 DIAGNOSIS — F17210 Nicotine dependence, cigarettes, uncomplicated: Secondary | ICD-10-CM | POA: Diagnosis not present

## 2019-10-18 NOTE — Progress Notes (Signed)
Cardiology Office Note:    Date:  10/18/2019   ID:  Brittany Jensen, DOB Jan 20, 1966, MRN [id number]  PCP:  Lowella Dandy, NP  Cardiologist:  Jenean Lindau, MD   Referring MD: Lowella Dandy, NP    ASSESSMENT:    1. Paroxysmal atrial fibrillation (HCC)   2. Cigarette smoker    PLAN:    In order of problems listed above:  1. Paroxysmal atrial fibrillation:I discussed with the patient atrial fibrillation, disease process. Management and therapy including rate and rhythm control, anticoagulation benefits and potential risks were discussed extensively with the patient. Patient had multiple questions which were answered to patient's satisfaction.  Patient's CHADS2 score is 0 therefore she is not on anticoagulation. 2. Cigarette smoker: She was counseled to quit smoking and she is working with her primary care physician for this.  Risks explained she vocalized understanding also blood work will be done by her primary care physician. 3. Patient will be seen in follow-up appointment in 6 months or earlier if the patient has any concerns    Medication Adjustments/Labs and Tests Ordered: Current medicines are reviewed at length with the patient today.  Concerns regarding medicines are outlined above.  No orders of the defined types were placed in this encounter.  No orders of the defined types were placed in this encounter.    No chief complaint on file.    History of Present Illness:    Brittany Jensen is a 54 y.o. female.  Patient has past medical history of paroxysmal atrial fibrillation.  Unfortunately she continues to smoke and is overweight.  She is leads a sedentary lifestyle.  She lost her dad and few weeks ago.  She is recovering from it.  No chest pain orthopnea PND palpitations dizziness or any such problems.  At the time of my evaluation, the patient is alert awake oriented and in no distress.  History reviewed. No pertinent past medical history.  History  reviewed. No pertinent surgical history.  Current Medications: Current Meds  Medication Sig  . ALPRAZolam (XANAX) 0.5 MG tablet Take 0.5 mg by mouth every 6 (six) hours as needed.  . flecainide (TAMBOCOR) 50 MG tablet TAKE 1 TABLET BY MOUTH TWICE A DAY  . ibuprofen (ADVIL,MOTRIN) 800 MG tablet Take 800 mg by mouth every 8 (eight) hours as needed.  . montelukast (SINGULAIR) 10 MG tablet Take 1 tablet by mouth daily.  Marland Kitchen omeprazole (PRILOSEC) 40 MG capsule Take 40 mg by mouth daily.   Current Facility-Administered Medications for the 10/18/19 encounter (Office Visit) with Deklyn Trachtenberg, Reita Cliche, MD  Medication  . triamcinolone acetonide (KENALOG) 10 MG/ML injection 10 mg     Allergies:   Ancef [cefazolin], Biaxin [clarithromycin], Penicillins, and Wellbutrin [bupropion]   Social History   Socioeconomic History  . Marital status: Married    Spouse name: Not on file  . Number of children: Not on file  . Years of education: Not on file  . Highest education level: Not on file  Occupational History  . Not on file  Tobacco Use  . Smoking status: Current Every Day Smoker    Types: Cigarettes  . Smokeless tobacco: Never Used  Substance and Sexual Activity  . Alcohol use: Yes    Alcohol/week: 0.0 standard drinks    Comment: occ  . Drug use: No  . Sexual activity: Not on file  Other Topics Concern  . Not on file  Social History Narrative  . Not on file  Social Determinants of Health   Financial Resource Strain:   . Difficulty of Paying Living Expenses: Not on file  Food Insecurity:   . Worried About Charity fundraiser in the Last Year: Not on file  . Ran Out of Food in the Last Year: Not on file  Transportation Needs:   . Lack of Transportation (Medical): Not on file  . Lack of Transportation (Non-Medical): Not on file  Physical Activity:   . Days of Exercise per Week: Not on file  . Minutes of Exercise per Session: Not on file  Stress:   . Feeling of Stress : Not on file    Social Connections:   . Frequency of Communication with Friends and Family: Not on file  . Frequency of Social Gatherings with Friends and Family: Not on file  . Attends Religious Services: Not on file  . Active Member of Clubs or Organizations: Not on file  . Attends Archivist Meetings: Not on file  . Marital Status: Not on file     Family History: The patient's family history includes Atrial fibrillation in her father.  ROS:   Please see the history of present illness.    All other systems reviewed and are negative.  EKGs/Labs/Other Studies Reviewed:    The following studies were reviewed today: EKG reveals sinus rhythm and nonspecific ST-T changes   Recent Labs: 04/08/2019: ALT 20; BUN 9; Creatinine, Ser 0.76; Hemoglobin 15.8; Platelets 254; Potassium 4.7; Sodium 137; TSH 1.030  Recent Lipid Panel    Component Value Date/Time   CHOL 207 (H) 04/08/2019 0808   TRIG 125 04/08/2019 0808   HDL 46 04/08/2019 0808   CHOLHDL 4.5 (H) 04/08/2019 0808   LDLCALC 136 (H) 04/08/2019 0808    Physical Exam:    VS:  BP 122/80   Pulse 89   Ht 5\' 9"  (1.753 m)   Wt 256 lb (116.1 kg)   SpO2 95%   BMI 37.80 kg/m     Wt Readings from Last 3 Encounters:  10/18/19 256 lb (116.1 kg)  03/26/19 240 lb (108.9 kg)  09/25/18 246 lb (111.6 kg)     GEN: Patient is in no acute distress HEENT: Normal NECK: No JVD; No carotid bruits LYMPHATICS: No lymphadenopathy CARDIAC: Hear sounds regular, 2/6 systolic murmur at the apex. RESPIRATORY:  Clear to auscultation without rales, wheezing or rhonchi  ABDOMEN: Soft, non-tender, non-distended MUSCULOSKELETAL:  No edema; No deformity  SKIN: Warm and dry NEUROLOGIC:  Alert and oriented x 3 PSYCHIATRIC:  Normal affect   Signed, Jenean Lindau, MD  10/18/2019 4:35 PM    [facility]

## 2019-10-18 NOTE — Patient Instructions (Signed)
Medication Instructions:  None ordered *If you need a refill on your cardiac medications before your next appointment, please call your pharmacy*  Lab Work: None Ordered If you have labs (blood work) drawn today and your tests are completely normal, you will receive your results only by: Marland Kitchen MyChart Message (if you have MyChart) OR . A paper copy in the mail If you have any lab test that is abnormal or we need to change your treatment, we will call you to review the results.  Testing/Procedures: None Ordered  Follow-Up: At [facility], you and your health needs are our priority.  As part of our continuing mission to provide you with exceptional heart care, we have created designated Provider Care Teams.  These Care Teams include your primary Cardiologist (physician) and Advanced Practice Providers (APPs -  Physician Assistants and Nurse Practitioners) who all work together to provide you with the care you need, when you need it.  Your next appointment:   6 month(s)  The format for your next appointment:   In Person  Provider:   Jyl Heinz, MD  Other Instructions NA

## 2019-11-05 ENCOUNTER — Ambulatory Visit: Payer: Managed Care, Other (non HMO)

## 2019-11-29 ENCOUNTER — Ambulatory Visit: Payer: Managed Care, Other (non HMO) | Attending: Internal Medicine

## 2019-11-29 DIAGNOSIS — Z23 Encounter for immunization: Secondary | ICD-10-CM

## 2019-11-29 NOTE — Progress Notes (Signed)
   Covid-19 Vaccination Clinic  Name:  ALEJANDRINA KILLINS    MRN: [id number] DOB: 12-Feb-1966  11/29/2019  [name] was observed post Covid-19 immunization for 15 minutes without incident. She was provided with Vaccine Information Sheet and instruction to access the V-Safe system.   Ms. Greenhut was instructed to call 911 with any severe reactions post vaccine: Marland Kitchen Difficulty breathing  . Swelling of face and throat  . A fast heartbeat  . A bad rash all over body  . Dizziness and weakness   Immunizations Administered    Name Date Dose VIS Date Route   Pfizer COVID-19 Vaccine 11/29/2019 10:55 AM 0.3 mL 08/27/2019 Intramuscular   Manufacturer: [place]   Lot: [id number]   [place]: [id number]

## 2020-02-01 ENCOUNTER — Encounter: Payer: Self-pay | Admitting: Gastroenterology

## 2020-02-03 ENCOUNTER — Other Ambulatory Visit: Payer: Self-pay | Admitting: Cardiology

## 2020-02-03 DIAGNOSIS — I480 Paroxysmal atrial fibrillation: Secondary | ICD-10-CM

## 2020-02-03 MED ORDER — FLECAINIDE ACETATE 50 MG PO TABS: 50.0000 mg | ORAL_TABLET | Freq: Two times a day (BID) | ORAL | 0 refills | Status: DC

## 2020-02-03 NOTE — Telephone Encounter (Signed)
Spoke with patient and let her know that I just sent in the 5 day refill for her flecainide.   She verbalizes understanding and does not have any other issues or concerns at this time.

## 2020-02-03 NOTE — Telephone Encounter (Signed)
*  STAT* If patient is at the pharmacy, call can be transferred to refill team.   1. Which medications need to be refilled? (please list name of each medication and dose if known)  flecainide (TAMBOCOR) 50 MG tablet  2. Which pharmacy/location (including street and city if local pharmacy) is medication to be sent to? [id number] - [place], [place] - [address]  3. Do they need a 30 day or 90 day supply? 5 days  Pt went to the beach and forgot to bring this medication with her. She is only at the beach through Sunday and would only need medication until she comes home

## 2020-02-15 ENCOUNTER — Ambulatory Visit (INDEPENDENT_AMBULATORY_CARE_PROVIDER_SITE_OTHER): Payer: Managed Care, Other (non HMO) | Admitting: Cardiology

## 2020-02-15 ENCOUNTER — Telehealth: Payer: Self-pay | Admitting: Cardiology

## 2020-02-15 ENCOUNTER — Encounter: Payer: Self-pay | Admitting: Cardiology

## 2020-02-15 ENCOUNTER — Other Ambulatory Visit: Payer: Self-pay

## 2020-02-15 VITALS — BP 130/78 | HR 55 | Ht 69.0 in | Wt 249.0 lb

## 2020-02-15 DIAGNOSIS — F17210 Nicotine dependence, cigarettes, uncomplicated: Secondary | ICD-10-CM

## 2020-02-15 DIAGNOSIS — I480 Paroxysmal atrial fibrillation: Secondary | ICD-10-CM | POA: Diagnosis not present

## 2020-02-15 NOTE — Progress Notes (Signed)
Cardiology Office Note:    Date:  02/15/2020   ID:  Brittany Jensen, DOB 12-20-1965, MRN [id number]  PCP:  Lowella Dandy, NP  Cardiologist:  Jenean Lindau, MD   Referring MD: Lowella Dandy, NP    ASSESSMENT:    1. Paroxysmal atrial fibrillation (HCC)   2. Cigarette smoker    PLAN:    In order of problems listed above:  1. Paroxysmal fibrillation:I discussed with the patient atrial fibrillation, disease process. Management and therapy including rate and rhythm control, anticoagulation benefits and potential risks were discussed extensively with the patient. Patient had multiple questions which were answered to patient's satisfaction. 2. She had an episode of elevated blood pressure with stress but this is coming down now.  She appears comfortable and in no distress.  Her exercise tolerance is excellent.  She will continue current therapy. 3. I told her to stop smoking and she promises to try to do so.  Risks explained. 4. Patient will be seen in follow-up appointment in 3 months or earlier if the patient has any concerns    Medication Adjustments/Labs and Tests Ordered: Current medicines are reviewed at length with the patient today.  Concerns regarding medicines are outlined above.  No orders of the defined types were placed in this encounter.  No orders of the defined types were placed in this encounter.    No chief complaint on file.    History of Present Illness:    Brittany Jensen is a 54 y.o. female.  Patient has past medical history approximately fibrillation and cigarette smoking.  She leads an active lifestyle and tells me that she no exercise on a regular basis.  Unfortunately she continues to smoke.  She mentions to me that today she had an episode of stress and her blood pressure was high subsequently she took alprazolam and felt much better.  Her husband wanted her to come and see me.  Therefore she is here for evaluation.  She has some jaw pain at times  but with exercise she has none of the symptoms of jaw pain or chest pain.  At the time of my evaluation, the patient is alert awake oriented and in no distress.  History reviewed. No pertinent past medical history.  History reviewed. No pertinent surgical history.  Current Medications: Current Meds  Medication Sig  . ALPRAZolam (XANAX) 0.5 MG tablet Take 0.5 mg by mouth every 6 (six) hours as needed.  . citalopram (CELEXA) 20 MG tablet Take 10 mg by mouth daily.  . flecainide (TAMBOCOR) 50 MG tablet Take 1 tablet (50 mg total) by mouth 2 (two) times daily.  Marland Kitchen ibuprofen (ADVIL,MOTRIN) 800 MG tablet Take 800 mg by mouth every 8 (eight) hours as needed.  . montelukast (SINGULAIR) 10 MG tablet Take 1 tablet by mouth daily.  Marland Kitchen omeprazole (PRILOSEC) 40 MG capsule Take 40 mg by mouth daily.   Current Facility-Administered Medications for the 02/15/20 encounter (Office Visit) with Leiana Rund, Reita Cliche, MD  Medication  . triamcinolone acetonide (KENALOG) 10 MG/ML injection 10 mg     Allergies:   Ancef [cefazolin], Biaxin [clarithromycin], Penicillins, and Wellbutrin [bupropion]   Social History   Socioeconomic History  . Marital status: Married    Spouse name: Not on file  . Number of children: Not on file  . Years of education: Not on file  . Highest education level: Not on file  Occupational History  . Not on file  Tobacco Use  . Smoking  status: Current Every Day Smoker    Types: Cigarettes  . Smokeless tobacco: Never Used  Substance and Sexual Activity  . Alcohol use: Yes    Alcohol/week: 0.0 standard drinks    Comment: occ  . Drug use: No  . Sexual activity: Not on file  Other Topics Concern  . Not on file  Social History Narrative  . Not on file   Social Determinants of Health   Financial Resource Strain:   . Difficulty of Paying Living Expenses:   Food Insecurity:   . Worried About Charity fundraiser in the Last Year:   . Arboriculturist in the Last Year:     Transportation Needs:   . Film/video editor (Medical):   Marland Kitchen Lack of Transportation (Non-Medical):   Physical Activity:   . Days of Exercise per Week:   . Minutes of Exercise per Session:   Stress:   . Feeling of Stress :   Social Connections:   . Frequency of Communication with Friends and Family:   . Frequency of Social Gatherings with Friends and Family:   . Attends Religious Services:   . Active Member of Clubs or Organizations:   . Attends Archivist Meetings:   Marland Kitchen Marital Status:      Family History: The patient's family history includes Atrial fibrillation in her father.  ROS:   Please see the history of present illness.    All other systems reviewed and are negative.  EKGs/Labs/Other Studies Reviewed:    The following studies were reviewed today: EKG reveals sinus rhythm and nonspecific ST-T changes QRS is within normal limits   Recent Labs: 04/08/2019: ALT 20; BUN 9; Creatinine, Ser 0.76; Hemoglobin 15.8; Platelets 254; Potassium 4.7; Sodium 137; TSH 1.030  Recent Lipid Panel    Component Value Date/Time   CHOL 207 (H) 04/08/2019 0808   TRIG 125 04/08/2019 0808   HDL 46 04/08/2019 0808   CHOLHDL 4.5 (H) 04/08/2019 0808   LDLCALC 136 (H) 04/08/2019 0808    Physical Exam:    VS:  BP 130/78   Pulse (!) 55   Ht 5\' 9"  (1.753 m)   Wt 249 lb (112.9 kg)   SpO2 98%   BMI 36.77 kg/m     Wt Readings from Last 3 Encounters:  02/15/20 249 lb (112.9 kg)  10/18/19 256 lb (116.1 kg)  03/26/19 240 lb (108.9 kg)     GEN: Patient is in no acute distress HEENT: Normal NECK: No JVD; No carotid bruits LYMPHATICS: No lymphadenopathy CARDIAC: Hear sounds regular, 2/6 systolic murmur at the apex. RESPIRATORY:  Clear to auscultation without rales, wheezing or rhonchi  ABDOMEN: Soft, non-tender, non-distended MUSCULOSKELETAL:  No edema; No deformity  SKIN: Warm and dry NEUROLOGIC:  Alert and oriented x 3 PSYCHIATRIC:  Normal affect   Signed, Jenean Lindau, MD  02/15/2020 3:31 PM    [facility]

## 2020-02-15 NOTE — Telephone Encounter (Signed)
Spoke with pt and advised her to come to the office that we would work her in per Dr. Geraldo Pitter.

## 2020-02-15 NOTE — Telephone Encounter (Signed)
New Message     Pt c/o BP issue: STAT if pt c/o blurred vision, one-sided weakness or slurred speech  1. What are your last 5 BP readings? 200/78 144/68   2. Are you having any other symptoms (ex. Dizziness, headache, blurred vision, passed out)? Headache and Fluttering Heart feeling   3. What is your BP issue? Pt says she woke up not feeling well and had a headache. She says she can feel her heart beat fluttering and has had Afib before    Please call

## 2020-02-15 NOTE — Patient Instructions (Signed)

## 2020-03-01 ENCOUNTER — Other Ambulatory Visit: Payer: Self-pay

## 2020-03-01 ENCOUNTER — Ambulatory Visit (AMBULATORY_SURGERY_CENTER): Payer: Self-pay

## 2020-03-01 VITALS — Ht 69.0 in | Wt 257.8 lb

## 2020-03-01 DIAGNOSIS — Z1211 Encounter for screening for malignant neoplasm of colon: Secondary | ICD-10-CM

## 2020-03-01 MED ORDER — SUTAB 1479-225-188 MG PO TABS: 12.0000 | ORAL_TABLET | ORAL | 0 refills | Status: DC

## 2020-03-01 NOTE — Progress Notes (Signed)
No allergies to soy or egg Pt is not on blood thinners or diet pills Denies issues with sedation/intubation Denies atrial flutter but has afib is on meds and stable at this time Denies constipation   Emmi instructions given to pt  Pt is aware of Covid safety and care partner requirements.

## 2020-03-03 ENCOUNTER — Encounter: Payer: Self-pay | Admitting: Gastroenterology

## 2020-03-15 ENCOUNTER — Other Ambulatory Visit: Payer: Self-pay

## 2020-03-15 ENCOUNTER — Encounter: Payer: Self-pay | Admitting: Gastroenterology

## 2020-03-15 ENCOUNTER — Ambulatory Visit (AMBULATORY_SURGERY_CENTER): Payer: Managed Care, Other (non HMO) | Admitting: Gastroenterology

## 2020-03-15 VITALS — BP 145/60 | HR 60 | Temp 98.0°F | Resp 16 | Ht 69.0 in | Wt 257.8 lb

## 2020-03-15 DIAGNOSIS — D124 Benign neoplasm of descending colon: Secondary | ICD-10-CM

## 2020-03-15 DIAGNOSIS — D125 Benign neoplasm of sigmoid colon: Secondary | ICD-10-CM | POA: Diagnosis not present

## 2020-03-15 DIAGNOSIS — Z1211 Encounter for screening for malignant neoplasm of colon: Secondary | ICD-10-CM | POA: Diagnosis present

## 2020-03-15 DIAGNOSIS — D123 Benign neoplasm of transverse colon: Secondary | ICD-10-CM

## 2020-03-15 DIAGNOSIS — K635 Polyp of colon: Secondary | ICD-10-CM | POA: Diagnosis not present

## 2020-03-15 DIAGNOSIS — D128 Benign neoplasm of rectum: Secondary | ICD-10-CM | POA: Diagnosis not present

## 2020-03-15 DIAGNOSIS — D122 Benign neoplasm of ascending colon: Secondary | ICD-10-CM | POA: Diagnosis not present

## 2020-03-15 DIAGNOSIS — D129 Benign neoplasm of anus and anal canal: Secondary | ICD-10-CM

## 2020-03-15 DIAGNOSIS — D120 Benign neoplasm of cecum: Secondary | ICD-10-CM | POA: Diagnosis not present

## 2020-03-15 MED ORDER — SODIUM CHLORIDE 0.9 % IV SOLN: 500.0000 mL | Freq: Once | INTRAVENOUS | Status: DC

## 2020-03-15 NOTE — Op Note (Signed)
[facility] Patient Name: Brittany Jensen Procedure Date: 03/15/2020 8:33 AM MRN: [id number] Endoscopist: Jackquline Denmark , MD Age: 54 Referring MD:  Date of Birth: 1965-10-19 Gender: Female Account #: [id number] Procedure:                Colonoscopy Indications:              Screening for colorectal malignant neoplasm Medicines:                Monitored Anesthesia Care Procedure:                Pre-Anesthesia Assessment:                           - Prior to the procedure, a History and Physical                            was performed, and patient medications and                            allergies were reviewed. The patient's tolerance of                            previous anesthesia was also reviewed. The risks                            and benefits of the procedure and the sedation                            options and risks were discussed with the patient.                            All questions were answered, and informed consent                            was obtained. Prior Anticoagulants: The patient has                            taken no previous anticoagulant or antiplatelet                            agents. ASA Grade Assessment: II - A patient with                            mild systemic disease. After reviewing the risks                            and benefits, the patient was deemed in                            satisfactory condition to undergo the procedure.                           After obtaining informed consent, the colonoscope  was passed under direct vision. Throughout the                            procedure, the patient's blood pressure, pulse, and                            oxygen saturations were monitored continuously. The                            Colonoscope was introduced through the anus and                            advanced to the the cecum, identified by                            appendiceal orifice and  ileocecal valve. The                            colonoscopy was performed without difficulty. The                            patient tolerated the procedure well. The quality                            of the bowel preparation was good. The ileocecal                            valve, appendiceal orifice, and rectum were                            photographed. Scope In: 8:48:19 AM Scope Out: 9:15:31 AM Scope Withdrawal Time: 0 hours 22 minutes 9 seconds  Total Procedure Duration: 0 hours 27 minutes 12 seconds  Findings:                 A 10 mm polyp was found in the cecum. The polyp was                            sessile. The polyp was removed with a hot snare.                            Resection and retrieval were complete.                           Four sessile polyps were found in the transverse                            colon (3) and ascending colon (1). The polyps were                            4 to 6 mm in size. These polyps were removed with a  cold snare. Resection and retrieval were complete.                           Six sessile polyps were found in the rectum (1),                            sigmoid colon (2) and descending colon (3). The                            polyps were 4 to 8 mm in size. These polyps were                            removed with a cold snare. Resection and retrieval                            were complete.                           A few small-mouthed diverticula were found in the                            sigmoid colon.                           Non-bleeding internal hemorrhoids were found during                            retroflexion. The hemorrhoids were small.                           The exam was otherwise without abnormality on                            direct and retroflexion views. Complications:            No immediate complications. Estimated Blood Loss:     Estimated blood loss: none. Impression:                - One 10 mm polyp in the cecum, removed with a hot                            snare. Resected and retrieved.                           - Four 4 to 6 mm polyps in the transverse colon and                            in the ascending colon, removed with a cold snare.                            Resected and retrieved.                           - Six 4 to 8 mm polyps in the rectum, in the  sigmoid colon and in the descending colon, removed                            with a cold snare. Resected and retrieved.                           - Mild sigmoid diverticulosis                           - Non-bleeding internal hemorrhoids.                           - The examination was otherwise normal on direct                            and retroflexion views. Recommendation:           - Patient has a contact number available for                            emergencies. The signs and symptoms of potential                            delayed complications were discussed with the                            patient. Return to normal activities tomorrow.                            Written discharge instructions were provided to the                            patient.                           - High fiber diet.                           - Continue present medications.                           - No aspirin, ibuprofen, naproxen, or other                            non-steroidal anti-inflammatory drugs for 5 days                            after polyp removal.                           - Await pathology results.                           - Repeat colonoscopy for surveillance based on                            pathology results. She would likely need it  at an                            earlier interval to clear colon of any small polyps.                           - The findings and recommendations were discussed                            with the patient's husband Brittany Co,  MD 03/15/2020 9:24:43 AM This report has been signed electronically.

## 2020-03-15 NOTE — Patient Instructions (Signed)
YOU HAD AN ENDOSCOPIC PROCEDURE TODAY AT THE [facility]:   Refer to the procedure report that was given to you for any specific questions about what was found during the examination.  If the procedure report does not answer your questions, please call your gastroenterologist to clarify.  If you requested that your care partner not be given the details of your procedure findings, then the procedure report has been included in a sealed envelope for you to review at your convenience later.  YOU SHOULD EXPECT: Some feelings of bloating in the abdomen. Passage of more gas than usual.  Walking can help get rid of the air that was put into your GI tract during the procedure and reduce the bloating. If you had a lower endoscopy (such as a colonoscopy or flexible sigmoidoscopy) you may notice spotting of blood in your stool or on the toilet paper. If you underwent a bowel prep for your procedure, you may not have a normal bowel movement for a few days.  Please Note:  You might notice some irritation and congestion in your nose or some drainage.  This is from the oxygen used during your procedure.  There is no need for concern and it should clear up in a day or so.  SYMPTOMS TO REPORT IMMEDIATELY:   Following lower endoscopy (colonoscopy or flexible sigmoidoscopy):  Excessive amounts of blood in the stool  Significant tenderness or worsening of abdominal pains  Swelling of the abdomen that is new, acute  Fever of 100F or higher  For urgent or emergent issues, a gastroenterologist can be reached at any hour by calling [phone]. Do not use MyChart messaging for urgent concerns.    DIET:  We do recommend a small meal at first, but then you may proceed to your regular diet.  Drink plenty of fluids but you should avoid alcoholic beverages for 24 hours.  ACTIVITY:  You should plan to take it easy for the rest of today and you should NOT DRIVE or use heavy machinery until tomorrow (because  of the sedation medicines used during the test).    FOLLOW UP: Our staff will call the number listed on your records 48-72 hours following your procedure to check on you and address any questions or concerns that you may have regarding the information given to you following your procedure. If we do not reach you, we will leave a message.  We will attempt to reach you two times.  During this call, we will ask if you have developed any symptoms of COVID 19. If you develop any symptoms (ie: fever, flu-like symptoms, shortness of breath, cough etc.) before then, please call [phone].  If you test positive for Covid 19 in the 2 weeks post procedure, please call and report this information to us.    If any biopsies were taken you will be contacted by phone or by letter within the next 1-3 weeks.  Please call us at [phone] if you have not heard about the biopsies in 3 weeks.    SIGNATURES/CONFIDENTIALITY: You and/or your care partner have signed paperwork which will be entered into your electronic medical record.  These signatures attest to the fact that that the information above on your After Visit Summary has been reviewed and is understood.  Full responsibility of the confidentiality of this discharge information lies with you and/or your care-partner. 

## 2020-03-15 NOTE — Progress Notes (Signed)
pt tolerated well. VSS. awake and to recovery. Report given to RN.  

## 2020-03-15 NOTE — Progress Notes (Signed)
Called to room to assist during endoscopic procedure.  Patient ID and intended procedure confirmed with present staff. Received instructions for my participation in the procedure from the performing physician.  

## 2020-03-15 NOTE — Progress Notes (Signed)
Pt's states no medical or surgical changes since previsit or office visit.   V/s-cw  Check-in-jb 

## 2020-03-17 ENCOUNTER — Telehealth: Payer: Self-pay

## 2020-03-17 NOTE — Telephone Encounter (Signed)
  Follow up Call-  Call back number 03/15/2020  Post procedure Call Back phone  # [phone]  Permission to leave phone message Yes  Some recent data might be hidden     Patient questions:  Do you have a fever, pain , or abdominal swelling? No. Pain Score  0 *  Have you tolerated food without any problems? Yes.    Have you been able to return to your normal activities? Yes.    Do you have any questions about your discharge instructions: Diet   No. Medications  No. Follow up visit  No.  Do you have questions or concerns about your Care? No.  Actions: * If pain score is 4 or above: No action needed, pain <4.  1. Have you developed a fever since your procedure? no  2.   Have you had an respiratory symptoms (SOB or cough) since your procedure? no  3.   Have you tested positive for COVID 19 since your procedure no  4.   Have you had any family members/close contacts diagnosed with the COVID 19 since your procedure?  no   If yes to any of these questions please route to Joylene John, RN and Erenest Rasher, RN

## 2020-03-21 ENCOUNTER — Other Ambulatory Visit: Payer: Self-pay | Admitting: Cardiology

## 2020-03-21 DIAGNOSIS — I480 Paroxysmal atrial fibrillation: Secondary | ICD-10-CM

## 2020-03-28 ENCOUNTER — Encounter: Payer: Self-pay | Admitting: Gastroenterology

## 2020-03-28 ENCOUNTER — Telehealth: Payer: Self-pay | Admitting: Genetic Counselor

## 2020-03-28 NOTE — Telephone Encounter (Signed)
Received a genetic counseling referral from [facility] for fhx of breast cancer. [name] has been cld and scheduled to see Raquel Sarna on 8/3 at 1pm. Pt aware to arrive 15 minutes early.

## 2020-04-17 ENCOUNTER — Telehealth: Payer: Self-pay | Admitting: Genetic Counselor

## 2020-04-17 NOTE — Telephone Encounter (Signed)
[name] called back to reschedule her genetic counseling appt to 8/31 at 2pm.

## 2020-04-18 ENCOUNTER — Other Ambulatory Visit: Payer: Managed Care, Other (non HMO)

## 2020-04-18 ENCOUNTER — Encounter: Payer: Managed Care, Other (non HMO) | Admitting: Genetic Counselor

## 2020-05-16 ENCOUNTER — Inpatient Hospital Stay: Payer: Managed Care, Other (non HMO) | Attending: Genetic Counselor | Admitting: Genetic Counselor

## 2020-05-16 ENCOUNTER — Inpatient Hospital Stay: Payer: Managed Care, Other (non HMO)

## 2020-05-16 ENCOUNTER — Other Ambulatory Visit: Payer: Self-pay

## 2020-05-16 DIAGNOSIS — Z803 Family history of malignant neoplasm of breast: Secondary | ICD-10-CM | POA: Diagnosis not present

## 2020-05-16 DIAGNOSIS — Z808 Family history of malignant neoplasm of other organs or systems: Secondary | ICD-10-CM | POA: Diagnosis not present

## 2020-05-16 DIAGNOSIS — Z801 Family history of malignant neoplasm of trachea, bronchus and lung: Secondary | ICD-10-CM | POA: Diagnosis not present

## 2020-05-17 NOTE — Progress Notes (Signed)
REFERRING PROVIDER: Lowella Dandy, NP [place],  [place] [address]  PRIMARY PROVIDER:  Lowella Dandy, NP  PRIMARY REASON FOR VISIT:  1. Family history of breast cancer   2. Family history of skin cancer   3. Family history of lung cancer   4. Family history of brain cancer       HISTORY OF PRESENT ILLNESS:   Brittany Jensen, a 54 y.o. female, was seen for a [place] cancer genetics consultation at the request of Brittany Peace, NP due to a family history of cancer.  Ms. Mallinger presents to clinic today to discuss the possibility of a hereditary predisposition to cancer, genetic testing, and to further clarify her future cancer risks, as well as potential cancer risks for family members.    Brittany Jensen does not have a personal history of cancer.    RISK FACTORS:  Menarche was at age 78.  First live birth at age 30.  OCP use for approximately 21 years.  Ovaries intact: yes.  Hysterectomy: no.  Menopausal status: perimenopausal.  HRT use: 0 years. Colonoscopy: yes; 11 polyps 03/15/20, most adenomatous. Mammogram within the last year: yes. Number of breast biopsies: 1. Any excessive radiation exposure in the past: no   Past Medical History:  Diagnosis Date  . Allergy    seasonal  . Anxiety   . Atrial fibrillation ([place])    On medication for it.  . Family history of brain cancer   . Family history of breast cancer   . Family history of lung cancer   . Family history of skin cancer     Past Surgical History:  Procedure Laterality Date  . APPENDECTOMY    . C-section    . CHOLECYSTECTOMY    . WRIST SURGERY      Social History   Socioeconomic History  . Marital status: Married    Spouse name: Not on file  . Number of children: Not on file  . Years of education: Not on file  . Highest education level: Not on file  Occupational History  . Not on file  Tobacco Use  . Smoking status: Current Every Day Smoker    Types: Cigarettes  .  Smokeless tobacco: Never Used  Vaping Use  . Vaping Use: Never used  Substance and Sexual Activity  . Alcohol use: Yes    Alcohol/week: 0.0 standard drinks    Comment: occ  . Drug use: No  . Sexual activity: Not on file  Other Topics Concern  . Not on file  Social History Narrative  . Not on file   Social Determinants of Health   Financial Resource Strain:   . Difficulty of Paying Living Expenses: Not on file  Food Insecurity:   . Worried About Charity fundraiser in the Last Year: Not on file  . Ran Out of Food in the Last Year: Not on file  Transportation Needs:   . Lack of Transportation (Medical): Not on file  . Lack of Transportation (Non-Medical): Not on file  Physical Activity:   . Days of Exercise per Week: Not on file  . Minutes of Exercise per Session: Not on file  Stress:   . Feeling of Stress : Not on file  Social Connections:   . Frequency of Communication with Friends and Family: Not on file  . Frequency of Social Gatherings with Friends and Family: Not on file  . Attends Religious Services: Not on file  .  Active Member of Clubs or Organizations: Not on file  . Attends Archivist Meetings: Not on file  . Marital Status: Not on file     FAMILY HISTORY:  We obtained a detailed, 4-generation family history.  Significant diagnoses are listed below: Family History  Problem Relation Age of Onset  . Atrial fibrillation Father   . Squamous cell carcinoma Father        multiple  . Breast cancer Mother 25       contralateral breast cancer dx. 66  . Lung cancer Maternal Grandfather        dx. in his 52s, smoker  . Breast cancer Paternal Grandmother        dx. younger than 74  . Cirrhosis Paternal Grandfather   . Breast cancer Other        dx. in her 39s, maternal great-aunt  . Stroke Paternal Aunt   . Brain cancer Cousin        dx. in her 36s, paternal first cousin  . Colon cancer Neg Hx   . Colon polyps Neg Hx   . Esophageal cancer Neg Hx   .  Rectal cancer Neg Hx   . Stomach cancer Neg Hx    Brittany Jensen has one son (age 25) and one daughter (age 72). She has one sister (age 91). None of these family members have had cancer.  Brittany Jensen mother died at the age of 48 and had a history of breast cancer diagnosed at age 60, then contralateral breast cancer diagnosed at age 72. She believes her mother had genetic testing, although she does not know the results and does not have a copy the report. Brittany Jensen has one maternal uncle who is 90 and has not had cancer. Her maternal grandmother died at the age of 54, and her maternal grandfather died in his 35s from lung cancer. Her maternal grandfather had a sister who had breast cancer in her 57s.  Brittany Jensen father died at the age of 58 from COVID-23. He had a history of multiple skin cancers (squamous cell). Brittany Jensen had two paternal aunts, neither of whom had cancer. One of her paternal first cousins died from brain cancer in her 2s. Her paternal grandmother died in her late 38s and had a history of breast cancer diagnosed younger than 42. Her paternal grandfather died at an unknown age due to cirrhosis of the liver.   Brittany Jensen is aware of previous family history of genetic testing for hereditary cancer risks. Patient's ancestors are of unknown descent. There is no reported Ashkenazi Jewish ancestry. There is no known consanguinity.  GENETIC COUNSELING ASSESSMENT: [name] is a 54 y.o. female with a family history of breast cancer which is somewhat suggestive of a hereditary cancer syndrome and predisposition to cancer. We, therefore, discussed and recommended the following at today's visit.   DISCUSSION: We discussed that approximately 5-10% of breast cancer is hereditary, with most cases associated with the BRCA1 and BRCA2 genes. There are other genes that can be associated with hereditary breast cancer syndromes. These include ATM, CHEK2,  PALB2, etc. [name] also has a personal history of colon polyps. Polyps in general are common; however, most people have fewer than 5 lifetime polyps. When an individual has 10 or more polyps we become concerned about an underlying polyposis syndrome. The most common hereditary polyposis syndromes are Familial Adenomatous Polyposis (FAP), caused by mutations in the APC gene, and MUTYH-Associated Polyposis (MAP), caused by mutations  in the MUTYH gene. There are other genes that are associated with polyposis, such as NTHL1 and MSH3.  We discussed that testing is beneficial for several reasons, including knowing about other cancer risks, identifying potential screening and risk-reduction options that may be appropriate, and to understand if other family members could be at risk for cancer and allow them to undergo genetic testing.  We reviewed the characteristics, features and inheritance patterns of hereditary cancer syndromes. We also discussed genetic testing, including the appropriate family members to test, the process of testing, insurance coverage and turn-around-time for results. We discussed the implications of a negative, positive and/or variant of uncertain significant result. We recommended Brittany Jensen pursue genetic testing for the Common Hereditary Cancers Panel.   The Common Hereditary Cancers Panel offered by [facility] includes sequencing and/or deletion duplication testing of the following 48 genes: APC, ATM, AXIN2, BARD1, BMPR1A, BRCA1, BRCA2, BRIP1, CDH1, CDK4, CDKN2A (p14ARF), CDKN2A (p16INK4a), CHEK2, CTNNA1, DICER1, EPCAM (Deletion/duplication testing only), GREM1 (promoter region deletion/duplication testing only), KIT, MEN1, MLH1, MSH2, MSH3, MSH6, MUTYH, NBN, NF1, NTHL1, PALB2, PDGFRA, PMS2, POLD1, POLE, PTEN, RAD50, RAD51C, RAD51D, RNF43, SDHB, SDHC, SDHD, SMAD4, SMARCA4. STK11, TP53, TSC1, TSC2, and VHL.  The following genes are evaluated for sequence changes only: SDHA  and HOXB13 c.251G>A variant only.   Based on Brittany Jensen's family history of cancer, she meets medical criteria for genetic testing. Despite that she meets criteria, she may still have an out of pocket cost. We discussed that if her out of pocket cost for testing is over $100, the laboratory will reach out to let her know. If the out of pocket cost of testing is less than $100 she will be billed by the genetic testing laboratory.   Lastly, we discussed that some people do not want to undergo genetic testing due to fear of genetic discrimination.  A federal law called the Genetic Information Non-Discrimination Act (GINA) of 2008 helps protect individuals against genetic discrimination based on their genetic test results.  It impacts both health insurance and employment.  With health insurance, it protects against increased premiums, being kicked off insurance or being forced to take a test in order to be insured.  For employment it protects against hiring, firing and promoting decisions based on genetic test results.  Health status due to a cancer diagnosis is not protected under GINA.  Additionally, life, disability, and long-term care insurance is not protected under GINA.   PLAN: After considering the risks, benefits, and limitations, Brittany Jensen provided informed consent to pursue genetic testing and the blood sample was sent to [facility] for analysis of the Common Hereditary Cancers Panel. Results should be available within approximately two-three weeks' time, at which point they will be disclosed by telephone to Brittany Jensen, as will any additional recommendations warranted by these results. Ms. Bohl will receive a summary of her genetic counseling visit and a copy of her results once available. This information will also be available in Epic.   Ms. Yarrow questions were answered to her satisfaction today. Our contact information was provided should  additional questions or concerns arise. Thank you for the referral and allowing [place] to share in the care of your patient.   Clint Guy, [place], [facility] Licensed, Certified [email or web address] Phone: [phone]  The patient was seen for a total of 40 minutes in face-to-face genetic counseling.  This patient was discussed with Drs. Magrinat, Lindi Adie and/or Burr Medico who agrees with the above.    _______________________________________________________________________ For [facility]  Staff:  Number of people involved in session: 1 Was an Intern/ student involved with case: no

## 2020-05-18 ENCOUNTER — Encounter: Payer: Self-pay | Admitting: Genetic Counselor

## 2020-05-18 DIAGNOSIS — Z801 Family history of malignant neoplasm of trachea, bronchus and lung: Secondary | ICD-10-CM | POA: Insufficient documentation

## 2020-05-18 DIAGNOSIS — Z803 Family history of malignant neoplasm of breast: Secondary | ICD-10-CM | POA: Insufficient documentation

## 2020-05-18 DIAGNOSIS — Z808 Family history of malignant neoplasm of other organs or systems: Secondary | ICD-10-CM | POA: Insufficient documentation

## 2020-05-19 ENCOUNTER — Ambulatory Visit: Payer: Managed Care, Other (non HMO) | Admitting: Cardiology

## 2020-05-30 ENCOUNTER — Telehealth: Payer: Self-pay | Admitting: Genetic Counselor

## 2020-05-30 ENCOUNTER — Encounter: Payer: Self-pay | Admitting: Genetic Counselor

## 2020-05-30 ENCOUNTER — Ambulatory Visit: Payer: Self-pay | Admitting: Genetic Counselor

## 2020-05-30 DIAGNOSIS — Z1379 Encounter for other screening for genetic and chromosomal anomalies: Secondary | ICD-10-CM

## 2020-05-30 HISTORY — DX: Encounter for other screening for genetic and chromosomal anomalies: Z13.79

## 2020-05-30 NOTE — Telephone Encounter (Signed)
Revealed negative genetic testing. Discussed that we do not know why there is cancer in the family. There could be a genetic mutation in the family that Brittany Jensen did not inherit. There could also be a mutation in a different gene that we are not testing, or our current technology may not be able detect certain mutations. It will therefore be important for her to stay in contact with genetics to keep up with whether additional testing may be appropriate in the future.

## 2020-05-30 NOTE — Telephone Encounter (Signed)
LVM that her genetic test results are available and requested that she call back to discuss them.  

## 2020-05-30 NOTE — Progress Notes (Signed)
HPI:  Brittany Jensen was previously seen in the [place] clinic due to a family history of cancer and concerns regarding a hereditary predisposition to cancer. Please refer to our prior cancer genetics clinic note for more information regarding our discussion, assessment and recommendations, at the time. Brittany Jensen recent genetic test results were disclosed to her, as were recommendations warranted by these results. These results and recommendations are discussed in more detail below.  FAMILY HISTORY:  We obtained a detailed, 4-generation family history.  Significant diagnoses are listed below: Family History  Problem Relation Age of Onset  . Atrial fibrillation Father   . Squamous cell carcinoma Father        multiple  . Breast cancer Mother 60       contralateral breast cancer dx. 45  . Lung cancer Maternal Grandfather        dx. in his 24s, smoker  . Breast cancer Paternal Grandmother        dx. younger than 87  . Cirrhosis Paternal Grandfather   . Breast cancer Other        dx. in her 74s, maternal great-aunt  . Stroke Paternal Aunt   . Brain cancer Cousin        dx. in her 35s, paternal first cousin  . Colon cancer Neg Hx   . Colon polyps Neg Hx   . Esophageal cancer Neg Hx   . Rectal cancer Neg Hx   . Stomach cancer Neg Hx    Brittany Jensen has one son (age 13) and one daughter (age 73). She has one sister (age 84). None of these family members have had cancer.  Brittany Jensen mother died at the age of 9 and had a history of breast cancer diagnosed at age 42, then contralateral breast cancer diagnosed at age 47. Her mother had negative genetic testing through the [facility] panel. Brittany Jensen has one maternal uncle who is 60 and has not had cancer. Her maternal grandmother died at the age of 85, and her maternal grandfather died in his 61s from lung cancer. Her maternal grandfather had a sister who had breast cancer in her  54s.  Brittany Jensen father died at the age of 26 from COVID-21. He had a history of multiple skin cancers (squamous cell). Brittany Jensen had two paternal aunts, neither of whom had cancer. One of her paternal first cousins died from brain cancer in her 64s. Her paternal grandmother died in her late 18s and had a history of breast cancer diagnosed younger than 11. Her paternal grandfather died at an unknown age due to cirrhosis of the liver.   Brittany Jensen is aware of previous family history of genetic testing for hereditary cancer risks. Patient's ancestors are of unknown descent. There is no reported Ashkenazi Jewish ancestry. There is no known consanguinity.  GENETIC TEST RESULTS: Genetic testing reported out on 05/25/2020 through the [facility] Common Hereditary Cancers panel. No pathogenic variants were detected.   The Common Hereditary Cancers Panel offered by [facility] includes sequencing and/or deletion duplication testing of the following 48 genes: APC, ATM, AXIN2, BARD1, BMPR1A, BRCA1, BRCA2, BRIP1, CDH1, CDK4, CDKN2A (p14ARF), CDKN2A (p16INK4a), CHEK2, CTNNA1, DICER1, EPCAM (Deletion/duplication testing only), GREM1 (promoter region deletion/duplication testing only), KIT, MEN1, MLH1, MSH2, MSH3, MSH6, MUTYH, NBN, NF1, NTHL1, PALB2, PDGFRA, PMS2, POLD1, POLE, PTEN, RAD50, RAD51C, RAD51D, RNF43, SDHB, SDHC, SDHD, SMAD4, SMARCA4. STK11, TP53, TSC1, TSC2, and VHL.  The following genes were evaluated for sequence changes only: SDHA  and HOXB13 c.251G>A variant only. The test report will be scanned into EPIC and located under the Molecular Pathology section of the Results Review tab.  A portion of the result report is included below for reference.     We discussed with Brittany Jensen that because current genetic testing is not perfect, it is possible there may be a gene mutation in one of these genes that current testing cannot detect, but that chance is small.  We also discussed that  there could be another gene that has not yet been discovered, or that we have not yet tested, that is responsible for the cancer diagnoses in the family. It is also possible there is a hereditary cause for the cancer in the family that Brittany Jensen did not inherit and therefore was not identified in her testing.  Therefore, it is important to remain in touch with cancer genetics in the future so that we can continue to offer Brittany Jensen the most up to date genetic testing.   CANCER SCREENING RECOMMENDATIONS: Brittany Jensen test result is considered negative (normal).  This means that we have not identified a hereditary cause for her family history of cancer at this time. While reassuring, this does not definitively rule out a hereditary predisposition to cancer. It is still possible that there could be genetic mutations that are undetectable by current technology. There could be genetic mutations in genes that have not been tested or identified to increase cancer risk.  Therefore, it is recommended she continue to follow the cancer management and screening guidelines provided by her primary healthcare provider.   An individual's cancer risk and medical management are not determined by genetic test results alone. Overall cancer risk assessment incorporates additional factors, including personal medical history, family history, and any available genetic information that may result in a personalized plan for cancer prevention and surveillance.  Based on Brittany Jensen's personal and family history, as well as her genetic test results, a statistical model Midwife) was used to estimate her risk of developing breast cancer. Tyrer-Cuzick estimates her lifetime risk of developing breast cancer to be approximately 31.3%.  This lifetime breast cancer risk is a preliminary estimate based on available information using one of several models endorsed by the [place] (ACS). The ACS  recommends consideration of breast MRI screening as an adjunct to mammography for patients at high risk (defined as 20% or greater lifetime risk).   Brittany Jensen has been determined to be at high risk for breast cancer. Therefore, we recommend that she have annual breast MRIs in addition to annual mammograms. We discussed that Brittany Jensen should discuss her individual situation with her referring physician and determine a breast cancer screening plan with which they are both comfortable. Of note, Brittany Jensen mentioned that she had considered preventative breast MRIs previously, but ultimately declined because her insurance would not cover them.    RECOMMENDATIONS FOR FAMILY MEMBERS:  Individuals in this family might be at some increased risk of developing cancer, over the general population risk, simply due to the family history of cancer.  We recommended women in this family have a yearly mammogram beginning at age 7, or 71 years younger than the earliest onset of cancer, an annual clinical breast exam, and perform monthly breast self-exams. Women in this family should also have a gynecological exam as recommended by their primary provider. All family members should be referred for colonoscopy starting at age 67.  FOLLOW-UP: Lastly, we discussed with Brittany Jensen that  cancer genetics is a rapidly advancing field and it is possible that new genetic tests will be appropriate for her and/or her family members in the future. We encouraged her to remain in contact with cancer genetics on an annual basis so we can update her personal and family histories and let her know of advances in cancer genetics that may benefit this family.   Our contact number was provided. Brittany Jensen questions were answered to her satisfaction, and she knows she is welcome to call us at anytime with additional questions or concerns.   Clint Guy, MS, [facility] Genetic  Counselor [email or web address] Phone: [phone]

## 2020-08-16 ENCOUNTER — Other Ambulatory Visit: Payer: Self-pay

## 2020-08-16 DIAGNOSIS — F419 Anxiety disorder, unspecified: Secondary | ICD-10-CM | POA: Insufficient documentation

## 2020-08-16 DIAGNOSIS — I4891 Unspecified atrial fibrillation: Secondary | ICD-10-CM | POA: Insufficient documentation

## 2020-08-16 DIAGNOSIS — T7840XA Allergy, unspecified, initial encounter: Secondary | ICD-10-CM | POA: Insufficient documentation

## 2020-08-17 ENCOUNTER — Other Ambulatory Visit: Payer: Self-pay

## 2020-08-17 ENCOUNTER — Ambulatory Visit (INDEPENDENT_AMBULATORY_CARE_PROVIDER_SITE_OTHER): Payer: Managed Care, Other (non HMO) | Admitting: Cardiology

## 2020-08-17 ENCOUNTER — Encounter: Payer: Self-pay | Admitting: Cardiology

## 2020-08-17 VITALS — BP 148/78 | HR 56 | Ht 69.5 in | Wt 238.0 lb

## 2020-08-17 DIAGNOSIS — E669 Obesity, unspecified: Secondary | ICD-10-CM

## 2020-08-17 DIAGNOSIS — I480 Paroxysmal atrial fibrillation: Secondary | ICD-10-CM

## 2020-08-17 DIAGNOSIS — E782 Mixed hyperlipidemia: Secondary | ICD-10-CM

## 2020-08-17 DIAGNOSIS — Z87891 Personal history of nicotine dependence: Secondary | ICD-10-CM

## 2020-08-17 DIAGNOSIS — E66811 Obesity, class 1: Secondary | ICD-10-CM

## 2020-08-17 HISTORY — DX: Obesity, unspecified: E66.9

## 2020-08-17 HISTORY — DX: Obesity, class 1: E66.811

## 2020-08-17 HISTORY — DX: Mixed hyperlipidemia: E78.2

## 2020-08-17 HISTORY — DX: Personal history of nicotine dependence: Z87.891

## 2020-08-17 NOTE — Progress Notes (Signed)
Cardiology Office Note:    Date:  08/17/2020   ID:  Brittany Jensen, DOB Apr 10, 1966, MRN [id number]  PCP:  Lowella Dandy, NP  Cardiologist:  Jenean Lindau, MD   Referring MD: Lowella Dandy, NP    ASSESSMENT:    1. Paroxysmal atrial fibrillation (HCC)   2. Ex-smoker   3. Obesity (BMI 30.0-34.9)   4. Mixed dyslipidemia    PLAN:    In order of problems listed above:  1. Primary prevention stressed with the patient.  Importance of compliance with diet medication stressed and she vocalized understanding. 2. Paroxysmal atrial fibrillation: Stable and in sinus rhythm without any symptoms.  She is doing very well with exercise and losing weight and hopefully this should even help improve and reduce her chances of recurrences. 3. Obesity: She has lost 22 pounds in the past few weeks with diet and exercise and I congratulated her about this. 4. Ex-smoker: She quit about 4 months ago and she promises now to go back.  She is fasting and will have blood work today. 5. Mixed dyslipidemia: Diet was emphasized we will check blood work today. 6. Patient will be seen in follow-up appointment in 6 months or earlier if the patient has any concerns    Medication Adjustments/Labs and Tests Ordered: Current medicines are reviewed at length with the patient today.  Concerns regarding medicines are outlined above.  No orders of the defined types were placed in this encounter.  No orders of the defined types were placed in this encounter.    No chief complaint on file.    History of Present Illness:    Brittany Jensen is a 54 y.o. female.  Patient has past medical history of paroxysmal atrial fibrillation.  She used to smoke heavily but quit about 4 months ago and has not smoked at all.  She is very happy about it.  She is also exercising regularly and has lost 22 pounds in the past 3 months.  She is very motivated now at this time and denies any problems.  No chest pain orthopnea or  PND.  At the time of my evaluation, the patient is alert awake oriented and in no distress.  Past Medical History:  Diagnosis Date  . Allergy    seasonal  . Anxiety   . Atrial fibrillation ([place])    On medication for it.  . Cigarette smoker 05/02/2017  . Family history of brain cancer   . Family history of breast cancer   . Family history of lung cancer   . Family history of skin cancer   . Genetic testing 05/30/2020   Negative genetic testing:  No pathogenic variants detected on the [facility] Common Hereditary Cancers Panel. The report date is 05/25/2020.   The Common Hereditary Cancers Panel offered by [facility] includes sequencing and/or deletion duplication testing of the following 48 genes: APC, ATM, AXIN2, BARD1, BMPR1A, BRCA1, BRCA2, BRIP1, CDH1, CDK4, CDKN2A (p14ARF), CDKN2A (p16INK4a), CHEK2, CTNNA1, DICER1,   . Paroxysmal atrial fibrillation ([place]) 05/02/2017    Past Surgical History:  Procedure Laterality Date  . APPENDECTOMY    . C-section    . CHOLECYSTECTOMY    . WRIST SURGERY      Current Medications: Current Meds  Medication Sig  . albuterol (VENTOLIN HFA) 108 (90 Base) MCG/ACT inhaler Inhale 2 puffs into the lungs every 4 (four) hours as needed.  . ALPRAZolam (XANAX) 0.5 MG tablet Take 0.5 mg by mouth every 6 (six) hours as  needed.  . cyclobenzaprine (FLEXERIL) 5 MG tablet Take 5 mg by mouth 3 (three) times daily as needed.  . flecainide (TAMBOCOR) 50 MG tablet TAKE 1 TABLET BY MOUTH TWICE A DAY  . ibuprofen (ADVIL,MOTRIN) 800 MG tablet Take 800 mg by mouth every 8 (eight) hours as needed.  Marland Kitchen omeprazole (PRILOSEC) 40 MG capsule Take 40 mg by mouth daily.  . Vitamin D, Ergocalciferol, (DRISDOL) 1.25 MG (50000 UNIT) CAPS capsule Take 50,000 Units by mouth once a week.     Allergies:   Ancef [cefazolin], Biaxin [clarithromycin], Penicillins, Prednisone, and Wellbutrin [bupropion]   Social History   Socioeconomic History  . Marital status: Married    Spouse name: Not on  file  . Number of children: Not on file  . Years of education: Not on file  . Highest education level: Not on file  Occupational History  . Not on file  Tobacco Use  . Smoking status: Current Every Day Smoker    Types: Cigarettes  . Smokeless tobacco: Never Used  Vaping Use  . Vaping Use: Never used  Substance and Sexual Activity  . Alcohol use: Yes    Alcohol/week: 0.0 standard drinks    Comment: occ  . Drug use: No  . Sexual activity: Not on file  Other Topics Concern  . Not on file  Social History Narrative  . Not on file   Social Determinants of Health   Financial Resource Strain:   . Difficulty of Paying Living Expenses: Not on file  Food Insecurity:   . Worried About Charity fundraiser in the Last Year: Not on file  . Ran Out of Food in the Last Year: Not on file  Transportation Needs:   . Lack of Transportation (Medical): Not on file  . Lack of Transportation (Non-Medical): Not on file  Physical Activity:   . Days of Exercise per Week: Not on file  . Minutes of Exercise per Session: Not on file  Stress:   . Feeling of Stress : Not on file  Social Connections:   . Frequency of Communication with Friends and Family: Not on file  . Frequency of Social Gatherings with Friends and Family: Not on file  . Attends Religious Services: Not on file  . Active Member of Clubs or Organizations: Not on file  . Attends Archivist Meetings: Not on file  . Marital Status: Not on file     Family History: The patient's family history includes Atrial fibrillation in her father; Brain cancer in her cousin; Breast cancer in her paternal grandmother and another family member; Breast cancer (age of onset: 75) in her mother; Cirrhosis in her paternal grandfather; Lung cancer in her maternal grandfather; Squamous cell carcinoma in her father; Stroke in her paternal aunt. There is no history of Colon cancer, Colon polyps, Esophageal cancer, Rectal cancer, or Stomach  cancer.  ROS:   Please see the history of present illness.    All other systems reviewed and are negative.  EKGs/Labs/Other Studies Reviewed:    The following studies were reviewed today: EKG reveals sinus rhythm and nonspecific ST-T changes with normal QRS.   Recent Labs: No results found for requested labs within last 8760 hours.  Recent Lipid Panel    Component Value Date/Time   CHOL 207 (H) 04/08/2019 0808   TRIG 125 04/08/2019 0808   HDL 46 04/08/2019 0808   CHOLHDL 4.5 (H) 04/08/2019 0808   LDLCALC 136 (H) 04/08/2019 0808    Physical  Exam:    VS:  BP (!) 148/78   Pulse (!) 56   Ht 5' 9.5" (1.765 m)   Wt 238 lb (108 kg)   SpO2 98%   BMI 34.64 kg/m     Wt Readings from Last 3 Encounters:  08/17/20 238 lb (108 kg)  03/15/20 257 lb 12.8 oz (116.9 kg)  03/01/20 257 lb 12.8 oz (116.9 kg)     GEN: Patient is in no acute distress HEENT: Normal NECK: No JVD; No carotid bruits LYMPHATICS: No lymphadenopathy CARDIAC: Hear sounds regular, 2/6 systolic murmur at the apex. RESPIRATORY:  Clear to auscultation without rales, wheezing or rhonchi  ABDOMEN: Soft, non-tender, non-distended MUSCULOSKELETAL:  No edema; No deformity  SKIN: Warm and dry NEUROLOGIC:  Alert and oriented x 3 PSYCHIATRIC:  Normal affect   Signed, Jenean Lindau, MD  08/17/2020 9:42 AM    [facility]

## 2020-08-17 NOTE — Patient Instructions (Signed)

## 2020-08-18 LAB — HEPATIC FUNCTION PANEL
ALT: 29 IU/L (ref 0–32)
AST: 19 IU/L (ref 0–40)
Albumin: 4.7 g/dL (ref 3.8–4.9)
Alkaline Phosphatase: 61 IU/L (ref 44–121)
Bilirubin Total: 0.4 mg/dL (ref 0.0–1.2)
Bilirubin, Direct: 0.13 mg/dL (ref 0.00–0.40)
Total Protein: 6.9 g/dL (ref 6.0–8.5)

## 2020-08-18 LAB — LIPID PANEL
Chol/HDL Ratio: 3.5 ratio (ref 0.0–4.4)
Cholesterol, Total: 205 mg/dL — ABNORMAL HIGH (ref 100–199)
HDL: 59 mg/dL (ref >39)
LDL Chol Calc (NIH): 131 mg/dL — ABNORMAL HIGH (ref 0–99)
Triglycerides: 85 mg/dL (ref 0–149)
VLDL Cholesterol Cal: 15 mg/dL (ref 5–40)

## 2020-08-18 LAB — CBC WITH DIFFERENTIAL/PLATELET
Basophils Absolute: 0 10*3/uL (ref 0.0–0.2)
Basos: 1 %
EOS (ABSOLUTE): 0.1 10*3/uL (ref 0.0–0.4)
Eos: 2 %
Hematocrit: 39.6 % (ref 34.0–46.6)
Hemoglobin: 13.9 g/dL (ref 11.1–15.9)
Immature Grans (Abs): 0 10*3/uL (ref 0.0–0.1)
Immature Granulocytes: 0 %
Lymphocytes Absolute: 2 10*3/uL (ref 0.7–3.1)
Lymphs: 32 %
MCH: 31.4 pg (ref 26.6–33.0)
MCHC: 35.1 g/dL (ref 31.5–35.7)
MCV: 90 fL (ref 79–97)
Monocytes Absolute: 0.4 10*3/uL (ref 0.1–0.9)
Monocytes: 6 %
Neutrophils Absolute: 3.7 10*3/uL (ref 1.4–7.0)
Neutrophils: 59 %
Platelets: 249 10*3/uL (ref 150–450)
RBC: 4.42 x10E6/uL (ref 3.77–5.28)
RDW: 12.7 % (ref 11.7–15.4)
WBC: 6.2 10*3/uL (ref 3.4–10.8)

## 2020-08-18 LAB — BASIC METABOLIC PANEL
BUN/Creatinine Ratio: 19 (ref 9–23)
BUN: 14 mg/dL (ref 6–24)
CO2: 24 mmol/L (ref 20–29)
Calcium: 9.8 mg/dL (ref 8.7–10.2)
Chloride: 105 mmol/L (ref 96–106)
Creatinine, Ser: 0.73 mg/dL (ref 0.57–1.00)
GFR calc Af Amer: 110 mL/min/{1.73_m2} (ref >59)
GFR calc non Af Amer: 95 mL/min/{1.73_m2} (ref >59)
Glucose: 101 mg/dL — ABNORMAL HIGH (ref 65–99)
Potassium: 4.4 mmol/L (ref 3.5–5.2)
Sodium: 142 mmol/L (ref 134–144)

## 2020-08-18 LAB — TSH: TSH: 1.23 u[IU]/mL (ref 0.450–4.500)

## 2021-01-08 ENCOUNTER — Other Ambulatory Visit: Payer: Self-pay

## 2021-01-08 DIAGNOSIS — I480 Paroxysmal atrial fibrillation: Secondary | ICD-10-CM

## 2021-01-08 MED ORDER — FLECAINIDE ACETATE 50 MG PO TABS: 50.0000 mg | ORAL_TABLET | Freq: Two times a day (BID) | ORAL | 1 refills | Status: DC

## 2021-01-08 NOTE — Telephone Encounter (Signed)
Refill of Flecainide 50 mg sent to [facility].

## 2021-02-21 ENCOUNTER — Other Ambulatory Visit: Payer: Self-pay

## 2021-02-21 NOTE — Telephone Encounter (Signed)
Appt reschedule per patient request.

## 2021-02-26 ENCOUNTER — Ambulatory Visit: Payer: Managed Care, Other (non HMO) | Admitting: Cardiology

## 2021-06-04 ENCOUNTER — Encounter: Payer: Self-pay | Admitting: Gastroenterology

## 2021-06-14 ENCOUNTER — Other Ambulatory Visit: Payer: Self-pay | Admitting: Cardiology

## 2021-06-14 DIAGNOSIS — I480 Paroxysmal atrial fibrillation: Secondary | ICD-10-CM

## 2021-06-18 ENCOUNTER — Ambulatory Visit: Payer: Self-pay | Admitting: Cardiology

## 2021-08-24 ENCOUNTER — Ambulatory Visit: Payer: Self-pay | Admitting: Cardiology

## 2021-08-31 ENCOUNTER — Other Ambulatory Visit: Payer: Self-pay | Admitting: Cardiology

## 2021-08-31 DIAGNOSIS — I480 Paroxysmal atrial fibrillation: Secondary | ICD-10-CM

## 2021-10-17 ENCOUNTER — Other Ambulatory Visit: Payer: Self-pay

## 2021-10-19 ENCOUNTER — Encounter: Payer: Self-pay | Admitting: Cardiology

## 2021-10-19 ENCOUNTER — Other Ambulatory Visit: Payer: Self-pay

## 2021-10-19 ENCOUNTER — Ambulatory Visit (INDEPENDENT_AMBULATORY_CARE_PROVIDER_SITE_OTHER): Payer: 59 | Admitting: Cardiology

## 2021-10-19 VITALS — BP 136/74 | HR 66 | Ht 69.6 in | Wt 240.2 lb

## 2021-10-19 DIAGNOSIS — E782 Mixed hyperlipidemia: Secondary | ICD-10-CM

## 2021-10-19 DIAGNOSIS — R0609 Other forms of dyspnea: Secondary | ICD-10-CM

## 2021-10-19 DIAGNOSIS — F17210 Nicotine dependence, cigarettes, uncomplicated: Secondary | ICD-10-CM | POA: Diagnosis not present

## 2021-10-19 DIAGNOSIS — I480 Paroxysmal atrial fibrillation: Secondary | ICD-10-CM | POA: Diagnosis not present

## 2021-10-19 HISTORY — DX: Other forms of dyspnea: R06.09

## 2021-10-19 MED ORDER — METOPROLOL TARTRATE 100 MG PO TABS: 100.0000 mg | ORAL_TABLET | Freq: Once | ORAL | 0 refills | Status: DC

## 2021-10-19 NOTE — Addendum Note (Signed)
Addended by: Truddie Hidden on: 10/19/2021 02:33 PM   Modules accepted: Orders

## 2021-10-19 NOTE — Patient Instructions (Signed)
Medication Instructions:  Your physician recommends that you continue on your current medications as directed. Please refer to the Current Medication list given to you today.  *If you need a refill on your cardiac medications before your next appointment, please call your pharmacy*   Lab Work: None ordered If you have labs (blood work) drawn today and your tests are completely normal, you will receive your results only by: [place] (if you have MyChart) OR A paper copy in the mail If you have any lab test that is abnormal or we need to change your treatment, we will call you to review the results.   Testing/Procedures: Your cardiac CT will be scheduled at:   [facility] [place], [place] [address] [phone]   If scheduled at [facility], please arrive at the [facility] main entrance of [facility] 30 minutes prior to test start time. Proceed to the [facility] Radiology Department (first floor) to check-in and test prep.  Please follow these instructions carefully (unless otherwise directed):    On the Night Before the Test: Be sure to Drink plenty of water. Do not consume any caffeinated/decaffeinated beverages or chocolate 12 hours prior to your test. Do not take any antihistamines 12 hours prior to your test.  On the Day of the Test: Drink plenty of water. Do not drink any water within one hour of the test. Do not eat any food 4 hours prior to the test. You may take your regular medications prior to the test.  Take metoprolol (Lopressor) two hours prior to test. FEMALES- please wear underwire-free bra if available   After the Test: Drink plenty of water. After receiving IV contrast, you may experience a mild flushed feeling. This is normal. On occasion, you may experience a mild rash up to 24 hours after the test. This is not dangerous. If this occurs, you can take Benadryl 25 mg and increase your fluid intake. If  you experience trouble breathing, this can be serious. If it is severe call 911 IMMEDIATELY. If it is mild, please call our office.    Once we have confirmed authorization from your insurance company, we will call you to set up a date and time for your test. Based on how quickly your insurance processes prior authorizations requests, please allow up to 4 weeks to be contacted for scheduling your Cardiac CT appointment. Be advised that routine Cardiac CT appointments could be scheduled as many as 8 weeks after your provider has ordered it.  For non-scheduling related questions, please contact the cardiac imaging nurse navigator should you have any questions/concerns: Marchia Bond, Cardiac Imaging Nurse Navigator Burley Saver, Interim Cardiac Imaging Nurse [place] [facility] Services Direct Office Dial: [phone]   For scheduling needs, including cancellations and rescheduling, please call Vivien Rota at [phone].     Follow-Up: At [facility], you and your health needs are our priority.  As part of our continuing mission to provide you with exceptional heart care, we have created designated Provider Care Teams.  These Care Teams include your primary Cardiologist (physician) and Advanced Practice Providers (APPs -  Physician Assistants and Nurse Practitioners) who all work together to provide you with the care you need, when you need it.  We recommend signing up for the patient portal called "MyChart".  Sign up information is provided on this After Visit Summary.  MyChart is used to connect with patients for Virtual Visits (Telemedicine).  Patients are  able to view lab/test results, encounter notes, upcoming appointments, etc.  Non-urgent messages can be sent to your provider as well.   To learn more about what you can do with MyChart, go to [email or web address].    Your next appointment:   9 month(s)  The format for your next appointment:   In Person  Provider:    Jyl Heinz, MD   Other Instructions Cardiac CT Angiogram A cardiac CT angiogram is a procedure to look at the heart and the area around the heart. It may be done to help find the cause of chest pains or other symptoms of heart disease. During this procedure, a substance called contrast dye is injected into the blood vessels in the area to be checked. A large X-ray machine, called a CT scanner, then takes detailed pictures of the heart and the surrounding area. The procedure is also sometimes called a coronary CT angiogram, coronary artery scanning, or CTA. A cardiac CT angiogram allows the health care provider to see how well blood is flowing to and from the heart. The health care provider will be able to see if there are any problems, such as: Blockage or narrowing of the coronary arteries in the heart. Fluid around the heart. Signs of weakness or disease in the muscles, valves, and tissues of the heart. Tell a health care provider about: Any allergies you have. This is especially important if you have had a previous allergic reaction to contrast dye. All medicines you are taking, including vitamins, herbs, eye drops, creams, and over-the-counter medicines. Any blood disorders you have. Any surgeries you have had. Any medical conditions you have. Whether you are pregnant or may be pregnant. Any anxiety disorders, chronic pain, or other conditions you have that may increase your stress or prevent you from lying still. What are the risks? Generally, this is a safe procedure. However, problems may occur, including: Bleeding. Infection. Allergic reactions to medicines or dyes. Damage to other structures or organs. Kidney damage from the contrast dye that is used. Increased risk of cancer from radiation exposure. This risk is low. Talk with your health care provider about: The risks and benefits of testing. How you can receive the lowest dose of radiation. What happens before the  procedure? Wear comfortable clothing and remove any jewelry, glasses, dentures, and hearing aids. Follow instructions from your health care provider about eating and drinking. This may include: For 12 hours before the procedure -- avoid caffeine. This includes tea, coffee, soda, energy drinks, and diet pills. Drink plenty of water or other fluids that do not have caffeine in them. Being well hydrated can prevent complications. For 4-6 hours before the procedure -- stop eating and drinking. The contrast dye can cause nausea, but this is less likely if your stomach is empty. Ask your health care provider about changing or stopping your regular medicines. This is especially important if you are taking diabetes medicines, blood thinners, or medicines to treat problems with erections (erectile dysfunction). What happens during the procedure?  Hair on your chest may need to be removed so that small sticky patches called electrodes can be placed on your chest. These will transmit information that helps to monitor your heart during the procedure. An IV will be inserted into one of your veins. You might be given a medicine to control your heart rate during the procedure. This will help to ensure that good images are obtained. You will be asked to lie on an exam table. This table will  slide in and out of the CT machine during the procedure. Contrast dye will be injected into the IV. You might feel warm, or you may get a metallic taste in your mouth. You will be given a medicine called nitroglycerin. This will relax or dilate the arteries in your heart. The table that you are lying on will move into the CT machine tunnel for the scan. The person running the machine will give you instructions while the scans are being done. You may be asked to: Keep your arms above your head. Hold your breath. Stay very still, even if the table is moving. When the scanning is complete, you will be moved out of the  machine. The IV will be removed. The procedure may vary among health care providers and hospitals. What can I expect after the procedure? After your procedure, it is common to have: A metallic taste in your mouth from the contrast dye. A feeling of warmth. A headache from the nitroglycerin. Follow these instructions at home: Take over-the-counter and prescription medicines only as told by your health care provider. If you are told, drink enough fluid to keep your urine pale yellow. This will help to flush the contrast dye out of your body. Most people can return to their normal activities right after the procedure. Ask your health care provider what activities are safe for you. It is up to you to get the results of your procedure. Ask your health care provider, or the department that is doing the procedure, when your results will be ready. Keep all follow-up visits as told by your health care provider. This is important. Contact a health care provider if: You have any symptoms of allergy to the contrast dye. These include: Shortness of breath. Rash or hives. A racing heartbeat. Summary A cardiac CT angiogram is a procedure to look at the heart and the area around the heart. It may be done to help find the cause of chest pains or other symptoms of heart disease. During this procedure, a large X-ray machine, called a CT scanner, takes detailed pictures of the heart and the surrounding area after a contrast dye has been injected into blood vessels in the area. Ask your health care provider about changing or stopping your regular medicines before the procedure. This is especially important if you are taking diabetes medicines, blood thinners, or medicines to treat erectile dysfunction. If you are told, drink enough fluid to keep your urine pale yellow. This will help to flush the contrast dye out of your body. This information is not intended to replace advice given to you by your health care  provider. Make sure you discuss any questions you have with your health care provider. Document Revised: 04/28/2019 Document Reviewed: 04/28/2019 Elsevier Patient Education  [place].

## 2021-10-19 NOTE — Progress Notes (Signed)
Cardiology Office Note:    Date:  10/19/2021   ID:  Brittany Jensen, DOB Oct 07, 1965, MRN [id number]  PCP:  Lowella Dandy, NP  Cardiologist:  Jenean Lindau, MD   Referring MD: Lowella Dandy, NP    ASSESSMENT:    1. Paroxysmal atrial fibrillation (HCC)   2. Mixed dyslipidemia   3. Cigarette smoker   4. Dyspnea on exertion    PLAN:    In order of problems listed above:  Primary prevention stressed with the patient.  Importance of compliance with diet medication stressed and she vocalized understanding. Cigarette smoker: Smokes cigarette smoking cessation was counseled and risks explained and she vocalized understanding Dyspnea on exertion: I discussed this with the patient extensively.  I am monitor in view of risk factors it would be prudent to do an evaluation for coronary arteries.  This will also help me motivated to quit smoking and also consider at the risk reduction therapy such as lipid lowering if she has any significant coronary artery disease.  She vocalized understanding and is agreeable.  We will do a CT coronary angiography for the same. Obesity: Weight reduction stressed and diet was emphasized.  Lipids followed by primary care. Patient will be seen in follow-up appointment in 3 months or earlier if the patient has any concerns    Medication Adjustments/Labs and Tests Ordered: Current medicines are reviewed at length with the patient today.  Concerns regarding medicines are outlined above.  No orders of the defined types were placed in this encounter.  No orders of the defined types were placed in this encounter.    No chief complaint on file.    History of Present Illness:    Brittany Jensen is a 56 y.o. female.  Patient has past medical history of paroxysmal atrial fibrillation, sedentary lifestyle and cigarette smoking.  She denies any problems at this time except for shortness of breath on exertion.  No chest pain orthopnea or PND.  She leads a  sedentary lifestyle.  Her shortness of breath issues have been going on for the past several months.  She has had issues with bursitis of her hip.  At the time of my evaluation, the patient is alert awake oriented and in no distress.  Past Medical History:  Diagnosis Date   Allergy    seasonal   Anxiety    Atrial fibrillation ([place])    On medication for it.   Cigarette smoker 05/02/2017   Ex-smoker 08/17/2020   Family history of brain cancer    Family history of breast cancer    Family history of lung cancer    Family history of skin cancer    Genetic testing 05/30/2020   Negative genetic testing:  No pathogenic variants detected on the [facility] Common Hereditary Cancers Panel. The report date is 05/25/2020.   The Common Hereditary Cancers Panel offered by [facility] includes sequencing and/or deletion duplication testing of the following 48 genes: APC, ATM, AXIN2, BARD1, BMPR1A, BRCA1, BRCA2, BRIP1, CDH1, CDK4, CDKN2A (p14ARF), CDKN2A (p16INK4a), CHEK2, CTNNA1, DICER1,    Mixed dyslipidemia 08/17/2020   Obesity (BMI 30.0-34.9) 08/17/2020   Paroxysmal atrial fibrillation ([place]) 05/02/2017    Past Surgical History:  Procedure Laterality Date   APPENDECTOMY     C-section     CHOLECYSTECTOMY     WRIST SURGERY      Current Medications: Current Meds  Medication Sig   albuterol (VENTOLIN HFA) 108 (90 Base) MCG/ACT inhaler Inhale 2 puffs into the lungs  every 4 (four) hours as needed for wheezing or shortness of breath.   ALPRAZolam (XANAX) 0.5 MG tablet Take 0.5 mg by mouth every 6 (six) hours as needed for anxiety.   cyclobenzaprine (FLEXERIL) 5 MG tablet Take 5 mg by mouth 3 (three) times daily as needed for muscle spasms.   flecainide (TAMBOCOR) 50 MG tablet TAKE 1 TABLET BY MOUTH  TWICE DAILY   ibuprofen (ADVIL,MOTRIN) 800 MG tablet Take 800 mg by mouth every 8 (eight) hours as needed for headache.   montelukast (SINGULAIR) 10 MG tablet Take 1 tablet by mouth daily.   omeprazole (PRILOSEC) 40  MG capsule Take 40 mg by mouth daily.   pregabalin (LYRICA) 75 MG capsule Take 75 mg by mouth 2 (two) times daily.   Vitamin D, Ergocalciferol, (DRISDOL) 1.25 MG (50000 UNIT) CAPS capsule Take 50,000 Units by mouth once a week.     Allergies:   Ancef [cefazolin], Biaxin [clarithromycin], Penicillins, Prednisone, and Wellbutrin [bupropion]   Social History   Socioeconomic History   Marital status: Married    Spouse name: Not on file   Number of children: Not on file   Years of education: Not on file   Highest education level: Not on file  Occupational History   Not on file  Tobacco Use   Smoking status: Every Day    Types: Cigarettes   Smokeless tobacco: Never  Vaping Use   Vaping Use: Never used  Substance and Sexual Activity   Alcohol use: Yes    Alcohol/week: 0.0 standard drinks    Comment: occ   Drug use: No   Sexual activity: Not on file  Other Topics Concern   Not on file  Social History Narrative   Not on file   Social Determinants of Health   Financial Resource Strain: Not on file  Food Insecurity: Not on file  Transportation Needs: Not on file  Physical Activity: Not on file  Stress: Not on file  Social Connections: Not on file     Family History: The patient's family history includes Atrial fibrillation in her father; Brain cancer in her cousin; Breast cancer in her paternal grandmother and another family member; Breast cancer (age of onset: 29) in her mother; Cirrhosis in her paternal grandfather; Lung cancer in her maternal grandfather; Squamous cell carcinoma in her father; Stroke in her paternal aunt. There is no history of Colon cancer, Colon polyps, Esophageal cancer, Rectal cancer, or Stomach cancer.  ROS:   Please see the history of present illness.    All other systems reviewed and are negative.  EKGs/Labs/Other Studies Reviewed:    The following studies were reviewed today: EKG reveals sinus rhythm normal QRS and nonspecific ST-T  changes   Recent Labs: No results found for requested labs within last 8760 hours.  Recent Lipid Panel    Component Value Date/Time   CHOL 205 (H) 08/17/2020 1035   TRIG 85 08/17/2020 1035   HDL 59 08/17/2020 1035   CHOLHDL 3.5 08/17/2020 1035   LDLCALC 131 (H) 08/17/2020 1035    Physical Exam:    VS:  BP 136/74    Pulse 66    Ht 5' 9.6" (1.768 m)    Wt 240 lb 3.2 oz (109 kg)    SpO2 96%    BMI 34.86 kg/m     Wt Readings from Last 3 Encounters:  10/19/21 240 lb 3.2 oz (109 kg)  08/17/20 238 lb (108 kg)  03/15/20 257 lb 12.8 oz (116.9 kg)  GEN: Patient is in no acute distress HEENT: Normal NECK: No JVD; No carotid bruits LYMPHATICS: No lymphadenopathy CARDIAC: Hear sounds regular, 2/6 systolic murmur at the apex. RESPIRATORY:  Clear to auscultation without rales, wheezing or rhonchi  ABDOMEN: Soft, non-tender, non-distended MUSCULOSKELETAL:  No edema; No deformity  SKIN: Warm and dry NEUROLOGIC:  Alert and oriented x 3 PSYCHIATRIC:  Normal affect   Signed, Jenean Lindau, MD  10/19/2021 2:24 PM    [place]

## 2021-10-30 ENCOUNTER — Telehealth (HOSPITAL_COMMUNITY): Payer: Self-pay | Admitting: *Deleted

## 2021-10-30 NOTE — Telephone Encounter (Signed)
Reaching out to patient to offer assistance regarding upcoming cardiac imaging study; pt verbalizes understanding of appt date/time, parking situation and where to check in, pre-test NPO status and medications ordered, and verified current allergies; name and call back number provided for further questions should they arise  Gordy Clement RN Navigator Cardiac Imaging [facility] [phone] office [phone] cell  Patient to take her AM dose of flecainide and 50mg  metoprolol tartrate two hours prior to her cardiac CT scan. She is aware to arrive at 7:15am for her 7:45am scan.  She has concerns about the cost of the scan and is awaiting insurance approval and estimate of cost. She may have to cancel if she cannot afford scan.

## 2021-11-01 ENCOUNTER — Ambulatory Visit (HOSPITAL_COMMUNITY)
Admission: RE | Admit: 2021-11-01 | Discharge: 2021-11-01 | Disposition: A | Discharge status: Home / Self Care | Payer: 59 | Source: Ambulatory Visit | Attending: Cardiology | Admitting: Cardiology

## 2021-11-01 ENCOUNTER — Other Ambulatory Visit: Payer: Self-pay

## 2021-11-01 ENCOUNTER — Encounter (HOSPITAL_COMMUNITY): Payer: Self-pay

## 2021-11-01 DIAGNOSIS — R931 Abnormal findings on diagnostic imaging of heart and coronary circulation: Secondary | ICD-10-CM

## 2021-11-01 DIAGNOSIS — R0609 Other forms of dyspnea: Secondary | ICD-10-CM | POA: Diagnosis not present

## 2021-11-01 DIAGNOSIS — I2510 Atherosclerotic heart disease of native coronary artery without angina pectoris: Secondary | ICD-10-CM

## 2021-11-01 IMAGING — CT CT HEART MORP W/ CTA COR W/ SCORE W/ CA W/CM &/OR W/O CM
4 of 7 series · 8 of 20 positions shown, 9 images · non-contrast
Comparison: None.
COMPARISON: None.

Addendum:
EXAM:
OVER-READ INTERPRETATION  CT CHEST

The following report is an over-read performed by radiologist Dr.
Davis Jim [REDACTED] on 11/01/2021. This over-read
does not include interpretation of cardiac or coronary anatomy or
pathology. The coronary calcium score/coronary CTA interpretation by
the cardiologist is attached.
CLINICAL DATA: 56 Year old White Female
Cardiac/Coronary  CTA
TECHNIQUE: The patient was scanned on a [employer] Force scanner.

[Series 6: best diast · axial · 0.39mm/px · z∈[-100,-60]mm · 2 of 298 slices shown, 3 images]
[im 100/298  vessel]
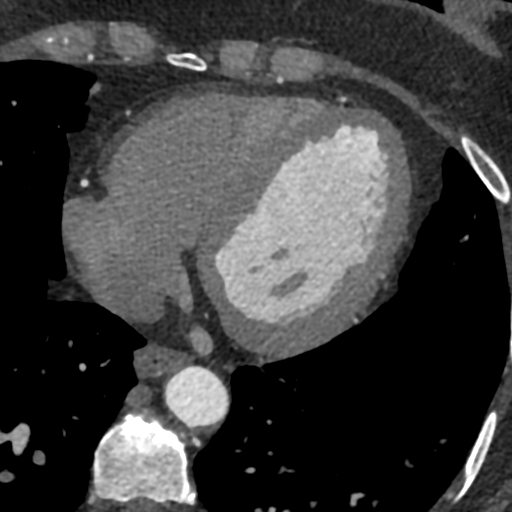
[im 100/298  lung]
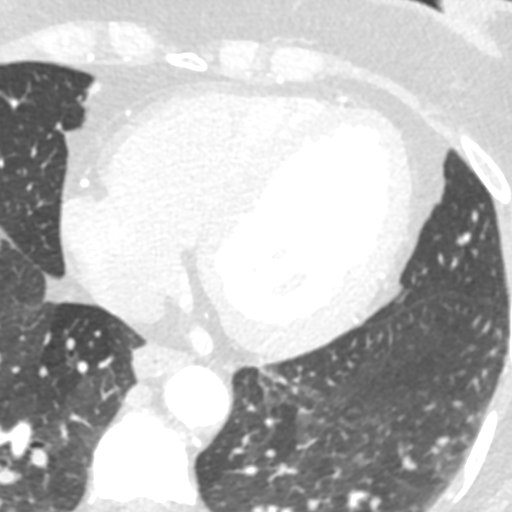
[im 199/298  vessel]
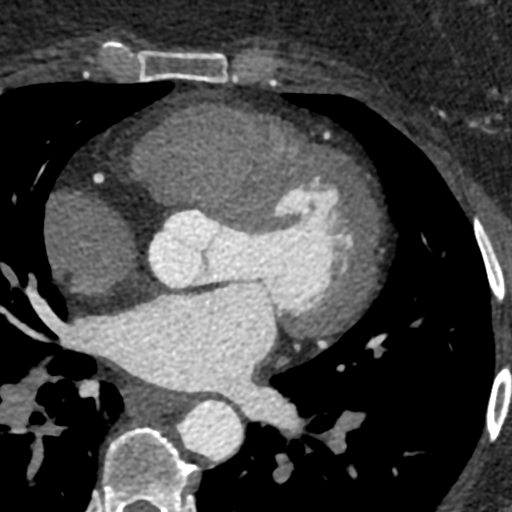

[Series 7: best syst · axial · 0.39mm/px · z∈[-100,-60]mm · 2 of 298 slices shown]
[im 100/298  vessel]
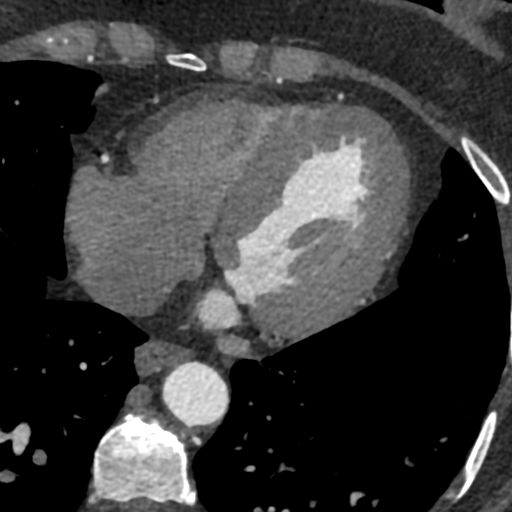
[im 199/298  vessel]
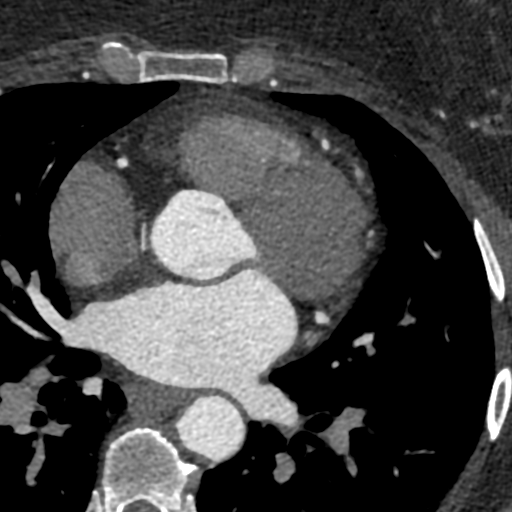

[Series 8: ts diast sharp · axial · 0.39mm/px · z∈[-100,-60]mm · 2 of 298 slices shown]
[im 100/298  lung]
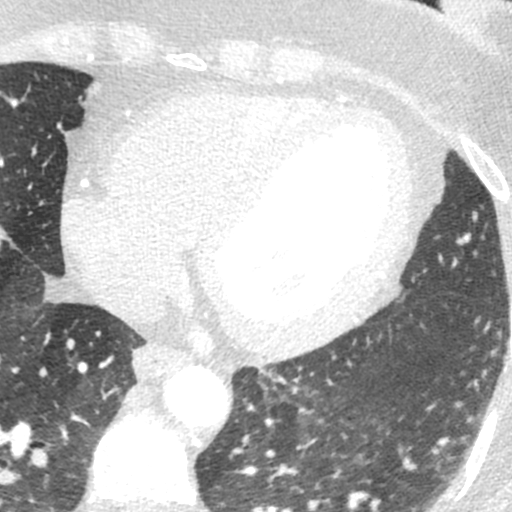
[im 199/298  lung]
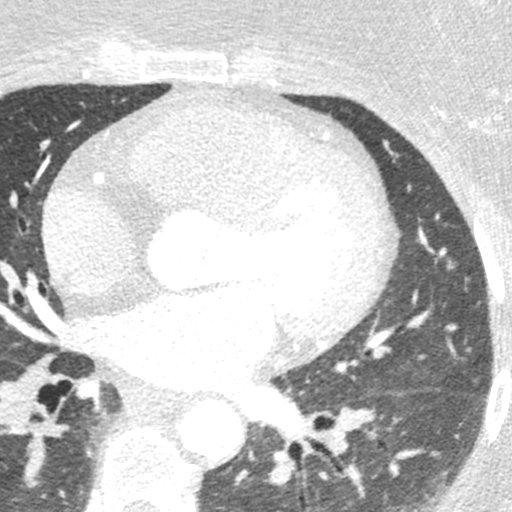

[Series 9: ts syst sharp · axial · 0.39mm/px · z∈[-100,-60]mm · 2 of 298 slices shown]
[im 100/298  lung]
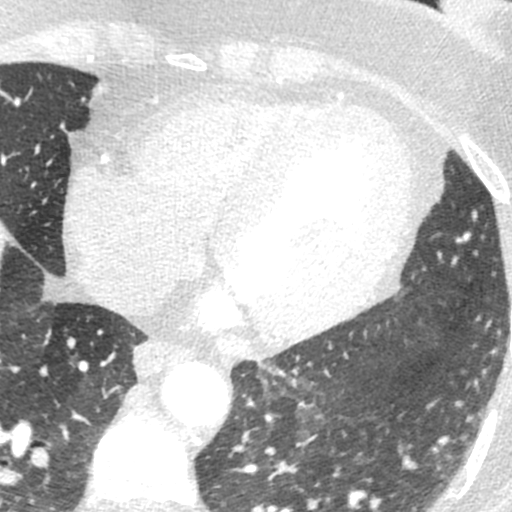
[im 199/298  lung]
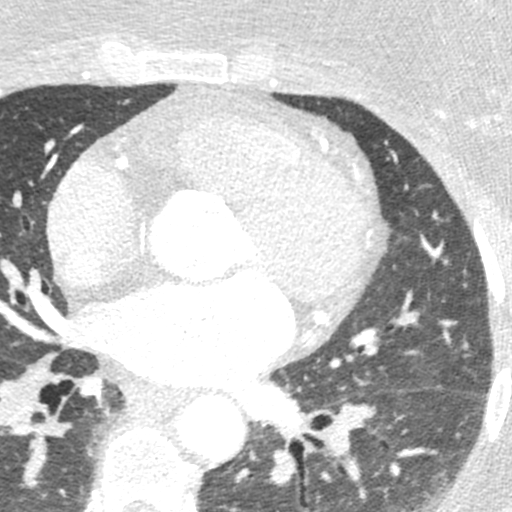

[8 of 20 positions shown; findings below may reference images not displayed]

FINDINGS: Vascular: Normal caliber of the visualized thoracic aorta

Mediastinum/Nodes: Visualized mediastinal structures are normal.

Lungs/Pleura: 3 mm nodule in the right middle lobe on sequence 11,
image 30. Additional 4 mm nodule along the right major fissure on
sequence 11 image 13. Dependent densities in both lower lobes are
most compatible with atelectasis. Subtle parenchymal densities near
the periphery of the left upper lobe on sequence 11 image 6 are
nonspecific. No large areas of airspace disease or consolidation. No
large pleural effusions.

Upper Abdomen: Images of the upper abdomen are unremarkable.

Musculoskeletal: No acute bone abnormality.
IMPRESSION: 1. Subtle parenchymal densities in the periphery of the left upper
lobe. These findings are nonspecific but could represent
inflammatory changes of unknown age.
2. Two small pulmonary nodules, largest measuring 4 mm. These small
nodules are indeterminate. No follow-up needed if patient is
low-risk (and has no known or suspected primary neoplasm).
Non-contrast chest CT can be considered in 12 months if patient is
high-risk. This recommendation follows the consensus statement:
Guidelines for Management of Incidental Pulmonary Nodules Detected
FINDINGS: Scan was triggered in the descending thoracic aorta. Axial
non-contrast 3 mm slices were carried out through the heart. The
data set was analyzed on a dedicated work station and scored using
the Agatson method. Gantry rotation speed was 250 msecs and
collimation was .6 mm. 0.8 mg of sl NTG was given. The 3D data set
was reconstructed in 5% intervals of the 67-82 % of the R-R cycle.
Diastolic phases were analyzed on a dedicated work station using
MPR, MIP and VRT modes. The patient received 95 cc of contrast.

Aorta:  Normal size.  No calcifications.  No dissection.

Main Pulmonary Artery: Dilation of the main pulmonary artery,
moderate, 32 mm.

Coronary Arteries:  Normal coronary origin.  Right dominance.

Coronary Calcium Score:

Left main: 54

Left anterior descending artery: 219

Left circumflex artery: 0

Right coronary artery: 0

Total: 273

Percentile: 99th for age, sex, and race matched control.

RCA is a large dominant artery that gives rise to PDA and PLA. There
is no significant plaque.

Left main is a large artery that gives rise to LAD and LCX arteries.
Minimal calcified plaque in the distal left main.

LAD is a large vessel that gives rise to two diagonal vessels. Mild
calcified plaque proximal LAD, moderate soft plaque stenosis mid
LAD.

LCX is a non-dominant artery that gives rise to one large OM1 branch
that bifurcates. There is minimal soft plaque in the proximal vessel

Other findings:

Normal pulmonary vein drainage into the left atrium.

Normal left atrial appendage without a thrombus.

Extra-cardiac findings: See attached radiology report for
non-cardiac structures.

Body motion slab artifact noted.
IMPRESSION: 1. Coronary calcium score of 273. This was 99th percentile for age,
sex, and race matched control.

2. Normal coronary origin with right dominance.

3. Dilation of the main pulmonary artery, moderate, 32 mm. This can
be associated with the presence of pulmonary hypertension; clinical
correlation advised.

4. CAD-RADS 3. Moderate stenosis mid LAD. Consider symptom-guided
anti-ischemic pharmacotherapy as well as risk factor modification
per guideline directed care. Additional analysis with CT FFR will be
submitted.

RECOMMENDATIONS:



If CAC = 0, it is reasonable to withhold statin therapy and reassess
in 5 to 10 years, as long as higher risk conditions are absent
(diabetes mellitus, family history of premature CHD in first degree
relatives (males <55 years; females <65 years), cigarette smoking,
LDL >=190 mg/dL or other independent risk factors).

If CAC is 1 to 99, it is reasonable to initiate statin therapy for
patients >=55 years of age.

If CAC is >=100 or >=75th percentile, it is reasonable to initiate
statin therapy at any age.

Cardiology referral should be considered for patients with CAC
scores =400 or >=75th percentile.

*0695 AHA/ACC/AACVPR/AAPA/ABC/SARDON/JAIRO ENRIQUE/GANAPT/Pou/BUBU/CHUDINOV/SLAOUI
Guideline on the Management of Blood Cholesterol: A Report of the
American College of Cardiology/American Heart Association Task Force
on Clinical Practice Guidelines. J Am Coll Cardiol.
8841;73(24):0660-0523.

*** End of Addendum ***
EXAM:
OVER-READ INTERPRETATION  CT CHEST

The following report is an over-read performed by radiologist Dr.
Davis Jim [REDACTED] on 11/01/2021. This over-read
does not include interpretation of cardiac or coronary anatomy or
pathology. The coronary calcium score/coronary CTA interpretation by
the cardiologist is attached.
FINDINGS: Vascular: Normal caliber of the visualized thoracic aorta

Mediastinum/Nodes: Visualized mediastinal structures are normal.

Lungs/Pleura: 3 mm nodule in the right middle lobe on sequence 11,
image 30. Additional 4 mm nodule along the right major fissure on
sequence 11 image 13. Dependent densities in both lower lobes are
most compatible with atelectasis. Subtle parenchymal densities near
the periphery of the left upper lobe on sequence 11 image 6 are
nonspecific. No large areas of airspace disease or consolidation. No
large pleural effusions.

Upper Abdomen: Images of the upper abdomen are unremarkable.

Musculoskeletal: No acute bone abnormality.
IMPRESSION: 1. Subtle parenchymal densities in the periphery of the left upper
lobe. These findings are nonspecific but could represent
inflammatory changes of unknown age.
2. Two small pulmonary nodules, largest measuring 4 mm. These small
nodules are indeterminate. No follow-up needed if patient is
low-risk (and has no known or suspected primary neoplasm).
Non-contrast chest CT can be considered in 12 months if patient is
high-risk. This recommendation follows the consensus statement:
Guidelines for Management of Incidental Pulmonary Nodules Detected

## 2021-11-01 MED ORDER — NITROGLYCERIN 0.4 MG SL SUBL
SUBLINGUAL_TABLET | SUBLINGUAL | Status: AC
  Filled 2021-11-01: qty 2

## 2021-11-01 MED ORDER — IOHEXOL 350 MG/ML SOLN
95.0000 mL | Freq: Once | INTRAVENOUS | Status: AC | PRN
  Administered 2021-11-01: 95 mL via INTRAVENOUS

## 2021-11-01 MED ORDER — NITROGLYCERIN 0.4 MG SL SUBL
0.8000 mg | SUBLINGUAL_TABLET | Freq: Once | SUBLINGUAL | Status: AC
  Administered 2021-11-01: 0.8 mg via SUBLINGUAL

## 2021-11-02 MED ORDER — NITROGLYCERIN 0.4 MG SL SUBL: 0.4000 mg | SUBLINGUAL_TABLET | SUBLINGUAL | 6 refills | Status: DC | PRN

## 2021-11-02 MED ORDER — ASPIRIN EC 81 MG PO TBEC: 81.0000 mg | DELAYED_RELEASE_TABLET | Freq: Every day | ORAL | 3 refills | Status: AC

## 2021-11-02 NOTE — Addendum Note (Signed)
Addended by: Truddie Hidden on: 11/02/2021 09:04 AM   Modules accepted: Orders

## 2021-11-02 NOTE — Addendum Note (Signed)
Addended by: Truddie Hidden on: 11/02/2021 08:57 AM   Modules accepted: Orders

## 2021-11-09 LAB — HEPATIC FUNCTION PANEL
ALT: 29 IU/L (ref 0–32)
AST: 20 IU/L (ref 0–40)
Albumin: 4 g/dL (ref 3.8–4.9)
Alkaline Phosphatase: 73 IU/L (ref 44–121)
Bilirubin Total: 0.3 mg/dL (ref 0.0–1.2)
Bilirubin, Direct: <0.1 mg/dL (ref 0.00–0.40)
Total Protein: 6 g/dL (ref 6.0–8.5)

## 2021-11-09 LAB — LIPID PANEL
Chol/HDL Ratio: 4.5 ratio — ABNORMAL HIGH (ref 0.0–4.4)
Cholesterol, Total: 170 mg/dL (ref 100–199)
HDL: 38 mg/dL — ABNORMAL LOW (ref >39)
LDL Chol Calc (NIH): 113 mg/dL — ABNORMAL HIGH (ref 0–99)
Triglycerides: 101 mg/dL (ref 0–149)
VLDL Cholesterol Cal: 19 mg/dL (ref 5–40)

## 2021-11-09 LAB — BASIC METABOLIC PANEL
BUN/Creatinine Ratio: 13 (ref 9–23)
BUN: 8 mg/dL (ref 6–24)
CO2: 26 mmol/L (ref 20–29)
Calcium: 9.2 mg/dL (ref 8.7–10.2)
Chloride: 107 mmol/L — ABNORMAL HIGH (ref 96–106)
Creatinine, Ser: 0.64 mg/dL (ref 0.57–1.00)
Glucose: 101 mg/dL — ABNORMAL HIGH (ref 70–99)
Potassium: 4.2 mmol/L (ref 3.5–5.2)
Sodium: 146 mmol/L — ABNORMAL HIGH (ref 134–144)
eGFR: 105 mL/min/{1.73_m2} (ref >59)

## 2021-11-12 MED ORDER — ATORVASTATIN CALCIUM 10 MG PO TABS: 10.0000 mg | ORAL_TABLET | Freq: Every day | ORAL | 3 refills | Status: DC

## 2021-11-12 NOTE — Addendum Note (Signed)
Addended by: Truddie Hidden on: 11/12/2021 01:43 PM   Modules accepted: Orders

## 2021-11-13 MED ORDER — NICOTINE 14 MG/24HR TD PT24: 14.0000 mg | MEDICATED_PATCH | Freq: Every day | TRANSDERMAL | 0 refills | Status: DC

## 2021-11-13 NOTE — Addendum Note (Signed)
Addended by: Truddie Hidden on: 11/13/2021 12:54 PM   Modules accepted: Orders

## 2021-11-26 ENCOUNTER — Encounter: Payer: Self-pay | Admitting: Cardiology

## 2021-11-27 ENCOUNTER — Telehealth: Payer: Self-pay

## 2021-11-27 MED ORDER — PITAVASTATIN CALCIUM 1 MG PO TABS: 1.0000 mg | ORAL_TABLET | Freq: Every day | ORAL | 12 refills | Status: DC

## 2021-11-27 NOTE — Telephone Encounter (Signed)
PA started on CMM for Livalo 1 mg. Key [id number] ?

## 2021-11-28 NOTE — Telephone Encounter (Signed)
PA approved for Livalo 1 mg until 11/28/22. ?

## 2022-02-14 ENCOUNTER — Encounter: Payer: Self-pay | Admitting: Cardiology

## 2022-02-15 NOTE — Telephone Encounter (Signed)
Pt cannot afford pitvastatin. Please advise as she has only tried Lipitor.

## 2022-04-17 ENCOUNTER — Encounter: Payer: Self-pay | Admitting: Cardiology

## 2022-04-18 ENCOUNTER — Other Ambulatory Visit: Payer: Self-pay

## 2022-04-18 DIAGNOSIS — E782 Mixed hyperlipidemia: Secondary | ICD-10-CM

## 2022-04-19 LAB — HEPATIC FUNCTION PANEL
ALT: 23 IU/L (ref 0–32)
AST: 15 IU/L (ref 0–40)
Albumin: 4.3 g/dL (ref 3.8–4.9)
Alkaline Phosphatase: 86 IU/L (ref 44–121)
Bilirubin Total: 0.3 mg/dL (ref 0.0–1.2)
Bilirubin, Direct: 0.1 mg/dL (ref 0.00–0.40)
Total Protein: 6.4 g/dL (ref 6.0–8.5)

## 2022-04-19 LAB — LIPID PANEL
Chol/HDL Ratio: 4.3 ratio (ref 0.0–4.4)
Cholesterol, Total: 183 mg/dL (ref 100–199)
HDL: 43 mg/dL (ref >39)
LDL Chol Calc (NIH): 119 mg/dL — ABNORMAL HIGH (ref 0–99)
Triglycerides: 116 mg/dL (ref 0–149)
VLDL Cholesterol Cal: 21 mg/dL (ref 5–40)

## 2022-05-03 ENCOUNTER — Telehealth: Payer: Self-pay

## 2022-05-03 NOTE — Telephone Encounter (Signed)
Spoke with pt about lab results per Dr. Julien Nordmann note to double Livalo and repeat labs in 6 weeks. Pt verbalized understanding.   She stated that she was taking samples and would like more if we have them available as the medications is expensive.

## 2022-07-05 ENCOUNTER — Encounter: Payer: Self-pay | Admitting: Gastroenterology

## 2022-08-07 ENCOUNTER — Other Ambulatory Visit: Payer: Self-pay

## 2022-08-07 ENCOUNTER — Ambulatory Visit (AMBULATORY_SURGERY_CENTER): Payer: Self-pay | Admitting: *Deleted

## 2022-08-07 VITALS — Ht 69.5 in | Wt 240.0 lb

## 2022-08-07 DIAGNOSIS — Z86010 Personal history of colonic polyps: Secondary | ICD-10-CM

## 2022-08-07 MED ORDER — SUTAB 1479-225-188 MG PO TABS: 1.0000 | ORAL_TABLET | Freq: Once | ORAL | 0 refills | Status: AC

## 2022-08-07 NOTE — Progress Notes (Signed)
Pre visit completed in person.    No egg or soy allergy known to patient  No issues known to pt with past sedation with any surgeries or procedures Patient denies ever being told they had issues or difficulty with intubation  No FH of Malignant Hyperthermia Pt is not on diet pills Pt is not on  home 02  Pt is not on blood thinners  Pt denies issues with constipation  No A fib or A flutter  Pt instructed to use Singlecare.com or GoodRx for a price reduction on prep   SUTAB coupon provided

## 2022-08-12 ENCOUNTER — Encounter: Payer: Self-pay | Admitting: Gastroenterology

## 2022-08-13 ENCOUNTER — Other Ambulatory Visit: Payer: Self-pay

## 2022-08-21 ENCOUNTER — Encounter: Payer: Self-pay | Admitting: Gastroenterology

## 2022-08-22 ENCOUNTER — Other Ambulatory Visit: Payer: Self-pay | Admitting: Cardiology

## 2022-08-22 DIAGNOSIS — I480 Paroxysmal atrial fibrillation: Secondary | ICD-10-CM

## 2022-08-26 ENCOUNTER — Encounter: Payer: Self-pay | Admitting: Gastroenterology

## 2022-08-26 ENCOUNTER — Ambulatory Visit (AMBULATORY_SURGERY_CENTER): Payer: 59 | Admitting: Gastroenterology

## 2022-08-26 VITALS — BP 149/68 | HR 60 | Temp 97.1°F | Resp 12 | Ht 69.0 in | Wt 240.0 lb

## 2022-08-26 DIAGNOSIS — Z86010 Personal history of colonic polyps: Secondary | ICD-10-CM | POA: Diagnosis not present

## 2022-08-26 DIAGNOSIS — Z09 Encounter for follow-up examination after completed treatment for conditions other than malignant neoplasm: Secondary | ICD-10-CM | POA: Diagnosis present

## 2022-08-26 MED ORDER — SODIUM CHLORIDE 0.9 % IV SOLN: 500.0000 mL | INTRAVENOUS | Status: DC

## 2022-08-26 NOTE — Progress Notes (Signed)
Pt. Complained of lower abdominal discomfort.  Pt. Placed in Trendelenberg on left side.  Than right side, then left.  Pt. Burped some air.  Pt. Requested in look at her right hand, as it hurts from unsuccessful IV attempt.  Pt. Reports she requested in admitting that her hand be used for the IV site.  No bleeding, bruising, or discoloration noted.  Told pt. To apply warm compresses at home, and bruising could be a possibility.

## 2022-08-26 NOTE — Op Note (Signed)
[facility] Patient Name: Thomas Rhude Procedure Date: 08/26/2022 8:16 AM MRN: [id number] Endoscopist: Jackquline Denmark , MD, [id number] Age: 56 Referring MD:  Date of Birth: 1966-09-09 Gender: Female Account #: [id number] Procedure:                Colonoscopy Indications:              High risk colon cancer surveillance: Personal                            history of multiple colonic polyps 2021 Medicines:                Monitored Anesthesia Care Procedure:                Pre-Anesthesia Assessment:                           - Prior to the procedure, a History and Physical                            was performed, and patient medications and                            allergies were reviewed. The patient's tolerance of                            previous anesthesia was also reviewed. The risks                            and benefits of the procedure and the sedation                            options and risks were discussed with the patient.                            All questions were answered, and informed consent                            was obtained. Prior Anticoagulants: The patient has                            taken no anticoagulant or antiplatelet agents. ASA                            Grade Assessment: II - A patient with mild systemic                            disease. After reviewing the risks and benefits,                            the patient was deemed in satisfactory condition to                            undergo the procedure.  After obtaining informed consent, the colonoscope                            was passed under direct vision. Throughout the                            procedure, the patient's blood pressure, pulse, and                            oxygen saturations were monitored continuously. The                            [id number]-HQ190L Colonoscope was introduced through the                            anus and  advanced to the 2 cm into the ileum. The                            colonoscopy was performed without difficulty. The                            patient tolerated the procedure well. The quality                            of the bowel preparation was good. The terminal                            ileum, ileocecal valve, appendiceal orifice, and                            rectum were photographed. Scope In: 8:26:58 AM Scope Out: 8:41:15 AM Scope Withdrawal Time: 0 hours 8 minutes 40 seconds  Total Procedure Duration: 0 hours 14 minutes 17 seconds  Findings:                 The colon (entire examined portion) appeared normal.                           Many medium-mouthed diverticula were found in the                            sigmoid colon.                           Non-bleeding internal hemorrhoids were found during                            retroflexion. The hemorrhoids were small and Grade                            I (internal hemorrhoids that do not prolapse).                           The terminal ileum appeared normal.  The exam was otherwise without abnormality on                            direct and retroflexion views. Complications:            No immediate complications. Estimated Blood Loss:     Estimated blood loss: none. Impression:               - Mild sigmoid diverticulosis.                           - Non-bleeding internal hemorrhoids.                           - The examined portion of the ileum was normal.                           - The examination was otherwise normal on direct                            and retroflexion views.                           - No specimens collected. Recommendation:           - Patient has a contact number available for                            emergencies. The signs and symptoms of potential                            delayed complications were discussed with the                            patient. Return to  normal activities tomorrow.                            Written discharge instructions were provided to the                            patient.                           - Resume previous diet.                           - Continue present medications.                           - Repeat colonoscopy in 5 years for                            screening/surveillance d/t H/O advanced polyps in                            past                           -  The findings and recommendations were discussed                            with the patient's family. Jackquline Denmark, MD 08/26/2022 8:46:48 AM This report has been signed electronically.

## 2022-08-26 NOTE — Progress Notes (Signed)
Vital signs checked by:CW  The patient states no changes in medical or surgical history since pre-visit screening on 08/07/22.

## 2022-08-26 NOTE — Progress Notes (Signed)
Sedate, gd SR, tolerated procedure well, VSS, report to RN 

## 2022-08-26 NOTE — Patient Instructions (Signed)
Impression/Recommendations:  Diverticulosis and hemorrhoid handouts given to patient.  Resume previous diet. Continue present medications. Repeat colonoscopy in 5 years for screening/surveillance due to history of advanced polyps in the past.  YOU HAD AN ENDOSCOPIC PROCEDURE TODAY AT [facility]:   Refer to the procedure report that was given to you for any specific questions about what was found during the examination.  If the procedure report does not answer your questions, please call your gastroenterologist to clarify.  If you requested that your care partner not be given the details of your procedure findings, then the procedure report has been included in a sealed envelope for you to review at your convenience later.  YOU SHOULD EXPECT: Some feelings of bloating in the abdomen. Passage of more gas than usual.  Walking can help get rid of the air that was put into your GI tract during the procedure and reduce the bloating. If you had a lower endoscopy (such as a colonoscopy or flexible sigmoidoscopy) you may notice spotting of blood in your stool or on the toilet paper. If you underwent a bowel prep for your procedure, you may not have a normal bowel movement for a few days.  Please Note:  You might notice some irritation and congestion in your nose or some drainage.  This is from the oxygen used during your procedure.  There is no need for concern and it should clear up in a day or so.  SYMPTOMS TO REPORT IMMEDIATELY:  Following lower endoscopy (colonoscopy or flexible sigmoidoscopy):  Excessive amounts of blood in the stool  Significant tenderness or worsening of abdominal pains  Swelling of the abdomen that is new, acute  Fever of 100F or higher  Black, tarry-looking stools  For urgent or emergent issues, a gastroenterologist can be reached at any hour by calling [phone]. Do not use MyChart messaging for urgent concerns.    DIET:  We do recommend a small  meal at first, but then you may proceed to your regular diet.  Drink plenty of fluids but you should avoid alcoholic beverages for 24 hours.  ACTIVITY:  You should plan to take it easy for the rest of today and you should NOT DRIVE or use heavy machinery until tomorrow (because of the sedation medicines used during the test).    FOLLOW UP: Our staff will call the number listed on your records the next business day following your procedure.  We will call around 7:15- 8:00 am to check on you and address any questions or concerns that you may have regarding the information given to you following your procedure. If we do not reach you, we will leave a message.     If any biopsies were taken you will be contacted by phone or by letter within the next 1-3 weeks.  Please call us at [phone] if you have not heard about the biopsies in 3 weeks.    SIGNATURES/CONFIDENTIALITY: You and/or your care partner have signed paperwork which will be entered into your electronic medical record.  These signatures attest to the fact that that the information above on your After Visit Summary has been reviewed and is understood.  Full responsibility of the confidentiality of this discharge information lies with you and/or your care-partner.

## 2022-08-26 NOTE — Progress Notes (Signed)
Pt. Comfortable at time of discharge, upon inquiry re: her back report of pain, she said she has degenerative disc disease.

## 2022-08-26 NOTE — Progress Notes (Signed)
[name] History and Physical   Primary Care Physician:  Lowella Dandy, NP   Reason for Procedure:   History of polyps  Plan:    colon     HPI: Brittany Jensen is a 56 y.o. female  No nausea, vomiting, heartburn, regurgitation, odynophagia or dysphagia.  No significant diarrhea or constipation.  No melena or hematochezia. No unintentional weight loss. No abdominal pain.   Past Medical History:  Diagnosis Date   Allergy    seasonal   Anxiety    Atrial fibrillation ([place])    On medication for it.   Cigarette smoker 05/02/2017   Ex-smoker 08/17/2020   Family history of brain cancer    Family history of breast cancer    Family history of lung cancer    Family history of skin cancer    Genetic testing 05/30/2020   Negative genetic testing:  No pathogenic variants detected on the [facility] Common Hereditary Cancers Panel. The report date is 05/25/2020.   The Common Hereditary Cancers Panel offered by [facility] includes sequencing and/or deletion duplication testing of the following 48 genes: APC, ATM, AXIN2, BARD1, BMPR1A, BRCA1, BRCA2, BRIP1, CDH1, CDK4, CDKN2A (p14ARF), CDKN2A (p16INK4a), CHEK2, CTNNA1, DICER1,    GERD (gastroesophageal reflux disease)    Hyperlipidemia    Mixed dyslipidemia 08/17/2020   Obesity (BMI 30.0-34.9) 08/17/2020   Paroxysmal atrial fibrillation ([place]) 05/02/2017    Past Surgical History:  Procedure Laterality Date   APPENDECTOMY     C-section     CHOLECYSTECTOMY     WRIST SURGERY      Prior to Admission medications   Medication Sig Start Date End Date Taking? Authorizing Provider  aspirin EC 81 MG tablet Take 1 tablet (81 mg total) by mouth daily. Swallow whole. 11/02/21  Yes Revankar, Reita Cliche, MD  cyclobenzaprine (FLEXERIL) 5 MG tablet Take 5 mg by mouth 3 (three) times daily as needed for muscle spasms. 07/11/20  Yes [provider]  flecainide (TAMBOCOR) 50 MG tablet TAKE 1 TABLET BY MOUTH TWICE  DAILY 08/22/22  Yes  Revankar, Reita Cliche, MD  montelukast (SINGULAIR) 10 MG tablet Take 1 tablet by mouth daily. 11/20/20  Yes [provider]  omeprazole (PRILOSEC) 40 MG capsule Take 40 mg by mouth daily.   Yes [provider]  Pitavastatin Calcium 1 MG TABS Take 1 tablet (1 mg total) by mouth daily. 11/27/21  Yes Revankar, Reita Cliche, MD  Vitamin D, Ergocalciferol, (DRISDOL) 1.25 MG (50000 UNIT) CAPS capsule Take 50,000 Units by mouth once a week. 03/01/20  Yes [provider]  albuterol (VENTOLIN HFA) 108 (90 Base) MCG/ACT inhaler Inhale 2 puffs into the lungs every 4 (four) hours as needed for wheezing or shortness of breath. 05/10/20   [provider]  ALPRAZolam Duanne Moron) 0.5 MG tablet Take 0.5 mg by mouth every 6 (six) hours as needed for anxiety. 10/20/15   [provider]  ibuprofen (ADVIL,MOTRIN) 800 MG tablet Take 800 mg by mouth every 8 (eight) hours as needed for headache.    [provider]  metoprolol tartrate (LOPRESSOR) 100 MG tablet Take 1 tablet (100 mg total) by mouth once for 1 dose. Take 2 hours prior to your CT if your heart rate is greater than 55 Patient not taking: Reported on 08/26/2022 10/19/21 10/19/21  Revankar, Reita Cliche, MD  nicotine (NICODERM CQ - DOSED IN MG/24 HOURS) 14 mg/24hr patch Place 1 patch (14 mg total) onto the skin daily. Patient not taking: Reported on 08/07/2022  11/13/21   Revankar, Reita Cliche, MD  nitroGLYCERIN (NITROSTAT) 0.4 MG SL tablet Place 1 tablet (0.4 mg total) under the tongue every 5 (five) minutes as needed. 11/02/21 01/31/22  Revankar, Reita Cliche, MD    Current Outpatient Medications  Medication Sig Dispense Refill   aspirin EC 81 MG tablet Take 1 tablet (81 mg total) by mouth daily. Swallow whole. 90 tablet 3   cyclobenzaprine (FLEXERIL) 5 MG tablet Take 5 mg by mouth 3 (three) times daily as needed for muscle spasms.     flecainide (TAMBOCOR) 50 MG tablet TAKE 1 TABLET BY MOUTH TWICE  DAILY 180 tablet 0   montelukast (SINGULAIR)  10 MG tablet Take 1 tablet by mouth daily.     omeprazole (PRILOSEC) 40 MG capsule Take 40 mg by mouth daily.     Pitavastatin Calcium 1 MG TABS Take 1 tablet (1 mg total) by mouth daily. 30 tablet 12   Vitamin D, Ergocalciferol, (DRISDOL) 1.25 MG (50000 UNIT) CAPS capsule Take 50,000 Units by mouth once a week.     albuterol (VENTOLIN HFA) 108 (90 Base) MCG/ACT inhaler Inhale 2 puffs into the lungs every 4 (four) hours as needed for wheezing or shortness of breath.     ALPRAZolam (XANAX) 0.5 MG tablet Take 0.5 mg by mouth every 6 (six) hours as needed for anxiety.  0   ibuprofen (ADVIL,MOTRIN) 800 MG tablet Take 800 mg by mouth every 8 (eight) hours as needed for headache.     metoprolol tartrate (LOPRESSOR) 100 MG tablet Take 1 tablet (100 mg total) by mouth once for 1 dose. Take 2 hours prior to your CT if your heart rate is greater than 55 (Patient not taking: Reported on 08/26/2022) 1 tablet 0   nicotine (NICODERM CQ - DOSED IN MG/24 HOURS) 14 mg/24hr patch Place 1 patch (14 mg total) onto the skin daily. (Patient not taking: Reported on 08/07/2022) 28 patch 0   nitroGLYCERIN (NITROSTAT) 0.4 MG SL tablet Place 1 tablet (0.4 mg total) under the tongue every 5 (five) minutes as needed. 25 tablet 6   Current Facility-Administered Medications  Medication Dose Route Frequency Provider Last Rate Last Admin   0.9 %  sodium chloride infusion  500 mL Intravenous Continuous Jackquline Denmark, MD        Allergies as of 08/26/2022 - Review Complete 08/26/2022  Allergen Reaction Noted   Ancef [cefazolin]  12/13/2015   Biaxin [clarithromycin]  12/13/2015   Penicillins  12/13/2015   Prednisone  03/01/2020   Wellbutrin [bupropion]  12/13/2015    Family History  Problem Relation Age of Onset   Atrial fibrillation Father    Squamous cell carcinoma Father        multiple   Breast cancer Mother 72       contralateral breast cancer dx. 10   Lung cancer Maternal Grandfather        dx. in his 73s, smoker    Breast cancer Paternal Grandmother        dx. younger than 44   Cirrhosis Paternal Grandfather    Breast cancer Other        dx. in her 10s, maternal great-aunt   Stroke Paternal Aunt    Brain cancer Cousin        dx. in her 51s, paternal first cousin   Colon cancer Neg Hx    Colon polyps Neg Hx    Esophageal cancer Neg Hx    Rectal cancer Neg Hx    Stomach cancer  Neg Hx     Social History   Socioeconomic History   Marital status: Married    Spouse name: Not on file   Number of children: Not on file   Years of education: Not on file   Highest education level: Not on file  Occupational History   Not on file  Tobacco Use   Smoking status: Every Day    Packs/day: 0.50    Types: Cigarettes   Smokeless tobacco: Never  Vaping Use   Vaping Use: Never used  Substance and Sexual Activity   Alcohol use: Yes    Alcohol/week: 0.0 standard drinks of alcohol    Comment: occ   Drug use: No   Sexual activity: Not on file  Other Topics Concern   Not on file  Social History Narrative   Not on file   Social Determinants of Health   Financial Resource Strain: Not on file  Food Insecurity: Not on file  Transportation Needs: Not on file  Physical Activity: Not on file  Stress: Not on file  Social Connections: Not on file  Intimate Partner Violence: Not on file    Review of Systems: Positive for none All other review of systems negative except as mentioned in the HPI.  Physical Exam: Vital signs in last 24 hours: _0 @   General:   Alert,  Well-developed, well-nourished, pleasant and cooperative in NAD Lungs:  Clear throughout to auscultation.   Heart:  Regular rate and rhythm; no murmurs, clicks, rubs,  or gallops. Abdomen:  Soft, nontender and nondistended. Normal bowel sounds.   Neuro/Psych:  Alert and cooperative. Normal mood and affect. A and O x 3    No significant changes were identified.  The patient continues to be an appropriate candidate for the  planned procedure and anesthesia.   Carmell Austria, MD. [facility] 08/26/2022 8:47 AM@

## 2022-08-27 ENCOUNTER — Telehealth: Payer: Self-pay

## 2022-08-27 NOTE — Telephone Encounter (Signed)
  Follow up Call-     08/26/2022    7:06 AM 03/15/2020    7:36 AM  Call back number  Post procedure Call Back phone  # [phone]  Permission to leave phone message Yes Yes     Patient questions:  Do you have a fever, pain , or abdominal swelling? No. Pain Score  0 *  Have you tolerated food without any problems? Yes.    Have you been able to return to your normal activities? Yes.    Do you have any questions about your discharge instructions: Diet   No. Medications  No. Follow up visit  No.  Do you have questions or concerns about your Care? No.  Actions: * If pain score is 4 or above: No action needed, pain <4.

## 2022-09-30 ENCOUNTER — Other Ambulatory Visit: Payer: Self-pay

## 2022-09-30 DIAGNOSIS — K219 Gastro-esophageal reflux disease without esophagitis: Secondary | ICD-10-CM | POA: Insufficient documentation

## 2022-09-30 DIAGNOSIS — E785 Hyperlipidemia, unspecified: Secondary | ICD-10-CM | POA: Insufficient documentation

## 2022-10-02 ENCOUNTER — Ambulatory Visit: Payer: 59 | Attending: Cardiology | Admitting: Cardiology

## 2022-10-02 ENCOUNTER — Encounter: Payer: Self-pay | Admitting: Cardiology

## 2022-10-02 VITALS — BP 142/76 | HR 73 | Ht 69.6 in | Wt 255.0 lb

## 2022-10-02 DIAGNOSIS — F17210 Nicotine dependence, cigarettes, uncomplicated: Secondary | ICD-10-CM

## 2022-10-02 DIAGNOSIS — I480 Paroxysmal atrial fibrillation: Secondary | ICD-10-CM | POA: Diagnosis not present

## 2022-10-02 DIAGNOSIS — E782 Mixed hyperlipidemia: Secondary | ICD-10-CM

## 2022-10-02 DIAGNOSIS — Z87891 Personal history of nicotine dependence: Secondary | ICD-10-CM | POA: Diagnosis not present

## 2022-10-02 DIAGNOSIS — E669 Obesity, unspecified: Secondary | ICD-10-CM

## 2022-10-02 HISTORY — DX: Obesity, unspecified: E66.9

## 2022-10-02 MED ORDER — PITAVASTATIN CALCIUM 4 MG PO TABS: 4.0000 mg | ORAL_TABLET | Freq: Every day | ORAL | 3 refills | Status: DC

## 2022-10-02 NOTE — Patient Instructions (Signed)
Medication Instructions:  Your physician has recommended you make the following change in your medication:   Increase your Pitvastatin to 4 mg daily.  *If you need a refill on your cardiac medications before your next appointment, please call your pharmacy*   Lab Work: Your physician recommends that you return for lab work in: 6 weeks You need to have labs done when you are fasting.  You can come Monday through Friday 8:30 am to 12:00 pm and 1:15 to 4:30. You do not need to make an appointment as the order has already been placed. The labs you are going to have done are CMP and Lipids.  If you have labs (blood work) drawn today and your tests are completely normal, you will receive your results only by: [place] (if you have MyChart) OR A paper copy in the mail If you have any lab test that is abnormal or we need to change your treatment, we will call you to review the results.   Testing/Procedures: None ordered   Follow-Up: At [facility], you and your health needs are our priority.  As part of our continuing mission to provide you with exceptional heart care, we have created designated Provider Care Teams.  These Care Teams include your primary Cardiologist (physician) and Advanced Practice Providers (APPs -  Physician Assistants and Nurse Practitioners) who all work together to provide you with the care you need, when you need it.  We recommend signing up for the patient portal called "MyChart".  Sign up information is provided on this After Visit Summary.  MyChart is used to connect with patients for Virtual Visits (Telemedicine).  Patients are able to view lab/test results, encounter notes, upcoming appointments, etc.  Non-urgent messages can be sent to your provider as well.   To learn more about what you can do with MyChart, go to [email or web address].    Your next appointment:   9 month(s)  The format for your next appointment:   In Person  Provider:    Jyl Heinz, MD    Other Instructions none  Important Information About Sugar

## 2022-10-02 NOTE — Addendum Note (Signed)
Addended by: Truddie Hidden on: 10/02/2022 03:48 PM   Modules accepted: Orders

## 2022-10-02 NOTE — Progress Notes (Signed)
Cardiology Office Note:    Date:  10/02/2022   ID:  Brittany Jensen, DOB [birth year], MRN [id number]  PCP:  Lowella Dandy, NP  Cardiologist:  Jenean Lindau, MD   Referring MD: Lowella Dandy, NP    ASSESSMENT:    1. Paroxysmal atrial fibrillation (HCC)   2. Mixed dyslipidemia   3. Ex-smoker    PLAN:    In order of problems listed above:  Coronary artery disease: Nonobstructive and mild: Secondary prevention stressed with the patient.  Importance of compliance with diet medication stressed and she vocalized understanding.  She was advised to walk at least half an hour a day 5 days a week and she promises to do so. Cigarette smoker: I spent 5 minutes with the patient discussing solely about smoking. Smoking cessation was counseled. I suggested to the patient also different medications and pharmacological interventions. Patient is keen to try stopping on its own at this time. He will get back to me if he needs any further assistance in this matter. Mixed dyslipidemia: On lipid-lowering medications.  She requests to send prescription for pitavastatin 4 mg daily.  She wants to do A tablet daily and come back for liver lipid check in 6 weeks.  I respect her wishes. Prediabetes state and obesity: I counseled her extensively about this and she promises to do better.  I counseled her against a sedentary lifestyle and toxicities of a sedentary lifestyle explained and she promises to do better.  Weight reduction stressed risks of obesity explained. Paroxysmal fibrillation:I discussed with the patient atrial fibrillation, disease process. Management and therapy including rate and rhythm control, anticoagulation benefits and potential risks were discussed extensively with the patient. Patient had multiple questions which were answered to patient's satisfaction.  Benefits and potential risks of flecainide explained. Patient will be seen in follow-up appointment in 6 months or earlier if the patient  has any concerns    Medication Adjustments/Labs and Tests Ordered: Current medicines are reviewed at length with the patient today.  Concerns regarding medicines are outlined above.  No orders of the defined types were placed in this encounter.  No orders of the defined types were placed in this encounter.    No chief complaint on file.    History of Present Illness:    Brittany Jensen is a 57 y.o. female.  Patient has past medical history of paroxysmal atrial fibrillation, mixed dyslipidemia, diet-controlled diabetes mellitus or prediabetes, morbid obesity and nonobstructive coronary artery disease.  Unfortunately she continues to smoke and leads a sedentary lifestyle.  She denies any chest pain orthopnea or PND.  At the time of my evaluation, the patient is alert awake oriented and in no distress.  Past Medical History:  Diagnosis Date   Allergy    seasonal   Anxiety    Atrial fibrillation ([place])    On medication for it.   Cigarette smoker 05/02/2017   Dyspnea on exertion 10/19/2021   Ex-smoker 08/17/2020   Family history of brain cancer    Family history of breast cancer    Family history of lung cancer    Family history of skin cancer    Genetic testing 05/30/2020   Negative genetic testing:  No pathogenic variants detected on the [facility] Common Hereditary Cancers Panel. The report date is 05/25/2020.   The Common Hereditary Cancers Panel offered by [facility] includes sequencing and/or deletion duplication testing of the following 48 genes: APC, ATM, AXIN2, BARD1, BMPR1A, BRCA1, BRCA2, BRIP1, CDH1, CDK4,  CDKN2A (p14ARF), CDKN2A (p16INK4a), CHEK2, CTNNA1, DICER1,    GERD (gastroesophageal reflux disease)    Hyperlipidemia    Mixed dyslipidemia 08/17/2020   Obesity (BMI 30.0-34.9) 08/17/2020   Paroxysmal atrial fibrillation ([place]) 05/02/2017    Past Surgical History:  Procedure Laterality Date   APPENDECTOMY     C-section     CHOLECYSTECTOMY     WRIST SURGERY       Current Medications: Current Meds  Medication Sig   albuterol (VENTOLIN HFA) 108 (90 Base) MCG/ACT inhaler Inhale 2 puffs into the lungs every 4 (four) hours as needed for wheezing or shortness of breath.   ALPRAZolam (XANAX) 0.5 MG tablet Take 0.5 mg by mouth every 6 (six) hours as needed for anxiety.   aspirin EC 81 MG tablet Take 1 tablet (81 mg total) by mouth daily. Swallow whole.   cyclobenzaprine (FLEXERIL) 5 MG tablet Take 5 mg by mouth 3 (three) times daily as needed for muscle spasms.   flecainide (TAMBOCOR) 50 MG tablet TAKE 1 TABLET BY MOUTH TWICE  DAILY   ibuprofen (ADVIL,MOTRIN) 800 MG tablet Take 800 mg by mouth every 8 (eight) hours as needed for headache.   montelukast (SINGULAIR) 10 MG tablet Take 1 tablet by mouth daily.   nicotine (NICODERM CQ - DOSED IN MG/24 HOURS) 14 mg/24hr patch Place 1 patch (14 mg total) onto the skin daily.   nitroGLYCERIN (NITROSTAT) 0.4 MG SL tablet Place 0.4 mg under the tongue every 5 (five) minutes as needed for chest pain.   omeprazole (PRILOSEC) 40 MG capsule Take 40 mg by mouth daily.   Pitavastatin Calcium 1 MG TABS Take 1 tablet (1 mg total) by mouth daily.   Vitamin D, Ergocalciferol, (DRISDOL) 1.25 MG (50000 UNIT) CAPS capsule Take 50,000 Units by mouth once a week.   Current Facility-Administered Medications for the 10/02/22 encounter (Office Visit) with Dyan Labarbera, Reita Cliche, MD  Medication   0.9 %  sodium chloride infusion     Allergies:   Ancef [cefazolin], Biaxin [clarithromycin], Penicillins, Prednisone, and Wellbutrin [bupropion]   Social History   Socioeconomic History   Marital status: Married    Spouse name: Not on file   Number of children: Not on file   Years of education: Not on file   Highest education level: Not on file  Occupational History   Not on file  Tobacco Use   Smoking status: Every Day    Packs/day: 0.50    Types: Cigarettes   Smokeless tobacco: Never  Vaping Use   Vaping Use: Never used   Substance and Sexual Activity   Alcohol use: Yes    Alcohol/week: 0.0 standard drinks of alcohol    Comment: occ   Drug use: No   Sexual activity: Not on file  Other Topics Concern   Not on file  Social History Narrative   Not on file   Social Determinants of Health   Financial Resource Strain: Not on file  Food Insecurity: Not on file  Transportation Needs: Not on file  Physical Activity: Not on file  Stress: Not on file  Social Connections: Not on file     Family History: The patient's family history includes Atrial fibrillation in her father; Brain cancer in her cousin; Breast cancer in her paternal grandmother and another family member; Breast cancer (age of onset: 34) in her mother; Cirrhosis in her paternal grandfather; Lung cancer in her maternal grandfather; Squamous cell carcinoma in her father; Stroke in her paternal aunt. There is no  history of Colon cancer, Colon polyps, Esophageal cancer, Rectal cancer, or Stomach cancer.  ROS:   Please see the history of present illness.    All other systems reviewed and are negative.  EKGs/Labs/Other Studies Reviewed:    The following studies were reviewed today: I discussed my findings with the patient at length.  EKG revealed sinus, nonspecific ST-T changes   Recent Labs: 11/09/2021: BUN 8; Creatinine, Ser 0.64; Potassium 4.2; Sodium 146 04/18/2022: ALT 23  Recent Lipid Panel    Component Value Date/Time   CHOL 183 04/18/2022 0817   TRIG 116 04/18/2022 0817   HDL 43 04/18/2022 0817   CHOLHDL 4.3 04/18/2022 0817   LDLCALC 119 (H) 04/18/2022 0817    Physical Exam:    VS:  BP (!) 142/76   Pulse 73   Ht 5' 9.6" (1.768 m)   Wt 255 lb (115.7 kg)   SpO2 97%   BMI 37.01 kg/m     Wt Readings from Last 3 Encounters:  10/02/22 255 lb (115.7 kg)  08/26/22 240 lb (108.9 kg)  08/07/22 240 lb (108.9 kg)     GEN: Patient is in no acute distress HEENT: Normal NECK: No JVD; No carotid bruits LYMPHATICS: No  lymphadenopathy CARDIAC: Hear sounds regular, 2/6 systolic murmur at the apex. RESPIRATORY:  Clear to auscultation without rales, wheezing or rhonchi  ABDOMEN: Soft, non-tender, non-distended MUSCULOSKELETAL:  No edema; No deformity  SKIN: Warm and dry NEUROLOGIC:  Alert and oriented x 3 PSYCHIATRIC:  Normal affect   Signed, Jenean Lindau, MD  10/02/2022 3:28 PM    [facility]

## 2022-10-31 ENCOUNTER — Other Ambulatory Visit: Payer: Self-pay | Admitting: Cardiology

## 2022-10-31 DIAGNOSIS — I480 Paroxysmal atrial fibrillation: Secondary | ICD-10-CM

## 2022-11-11 ENCOUNTER — Telehealth: Payer: Self-pay | Admitting: Cardiology

## 2022-11-11 NOTE — Telephone Encounter (Signed)
Pt c/o medication issue:  1. Name of Medication: Pitavastatin Calcium 4 MG TABS   2. How are you currently taking this medication (dosage and times per day)? N/A  3. Are you having a reaction (difficulty breathing--STAT)? N/A  4. What is your medication issue? Pharmacy is wanting to know what form of medication to prescribe wether it be generic or brand name. Please advise.

## 2022-11-12 NOTE — Telephone Encounter (Signed)
Left VM for [facility] as I was on hold for 15 min. RX is pt preference since the medication has gone generic.

## 2023-02-18 LAB — COMPREHENSIVE METABOLIC PANEL: EGFR: 90

## 2023-02-18 LAB — HEMOGLOBIN A1C: A1c: 5.8

## 2023-02-20 ENCOUNTER — Encounter: Payer: Self-pay | Admitting: Cardiology

## 2023-02-20 DIAGNOSIS — E785 Hyperlipidemia, unspecified: Secondary | ICD-10-CM

## 2023-02-20 DIAGNOSIS — E782 Mixed hyperlipidemia: Secondary | ICD-10-CM

## 2023-02-28 ENCOUNTER — Other Ambulatory Visit: Payer: Self-pay | Admitting: Cardiology

## 2023-02-28 DIAGNOSIS — E782 Mixed hyperlipidemia: Secondary | ICD-10-CM

## 2023-02-28 DIAGNOSIS — E785 Hyperlipidemia, unspecified: Secondary | ICD-10-CM

## 2023-02-28 MED ORDER — NEXLIZET 180-10 MG PO TABS: 1.0000 | ORAL_TABLET | Freq: Every day | ORAL | 3 refills | Status: DC

## 2023-02-28 NOTE — Telephone Encounter (Signed)
Can you do a PA on the Nexlizet.  Thank you

## 2023-03-03 ENCOUNTER — Telehealth: Payer: Self-pay

## 2023-03-03 ENCOUNTER — Other Ambulatory Visit (HOSPITAL_COMMUNITY): Payer: Self-pay

## 2023-03-03 NOTE — Telephone Encounter (Signed)
Pharmacy Patient Advocate Encounter  Prior Authorization for nexlizet 180-10mg  has been APPROVED by [facility] from 6.17.24 to 6.17.25.

## 2023-03-03 NOTE — Telephone Encounter (Signed)
Pharmacy Patient Advocate Encounter   Received notification from RN that prior authorization for NEXLIZET 180-10MG  is required/requested.   PA submitted to [facility] via CoverMyMeds Key or (Medicaid) confirmation # [id number]    Status is pending

## 2023-03-04 ENCOUNTER — Other Ambulatory Visit (HOSPITAL_COMMUNITY): Payer: Self-pay

## 2023-03-04 NOTE — Telephone Encounter (Signed)
PA expires 03/02/2024, patient should be able to get medication.

## 2023-03-06 ENCOUNTER — Other Ambulatory Visit: Payer: Self-pay | Admitting: Pharmacist

## 2023-03-06 DIAGNOSIS — E782 Mixed hyperlipidemia: Secondary | ICD-10-CM

## 2023-03-06 DIAGNOSIS — E785 Hyperlipidemia, unspecified: Secondary | ICD-10-CM

## 2023-03-06 MED ORDER — NEXLIZET 180-10 MG PO TABS: 1.0000 | ORAL_TABLET | Freq: Every day | ORAL | 1 refills | Status: DC

## 2023-04-15 NOTE — Telephone Encounter (Signed)
 PA has been submitted and documented in separate encounter, please sign off on rx in this encounter as PA team is unable to resolve RX requests. Thank you

## 2023-04-30 ENCOUNTER — Encounter: Payer: Self-pay | Admitting: Cardiology

## 2023-06-29 ENCOUNTER — Other Ambulatory Visit: Payer: Self-pay | Admitting: Cardiology

## 2023-06-29 DIAGNOSIS — I480 Paroxysmal atrial fibrillation: Secondary | ICD-10-CM

## 2023-07-30 ENCOUNTER — Encounter: Payer: Self-pay | Admitting: Cardiology

## 2023-07-30 DIAGNOSIS — E785 Hyperlipidemia, unspecified: Secondary | ICD-10-CM

## 2023-07-30 DIAGNOSIS — E782 Mixed hyperlipidemia: Secondary | ICD-10-CM

## 2023-07-30 MED ORDER — NEXLIZET 180-10 MG PO TABS: 1.0000 | ORAL_TABLET | Freq: Every day | ORAL | 3 refills | Status: DC

## 2023-09-17 ENCOUNTER — Ambulatory Visit (HOSPITAL_BASED_OUTPATIENT_CLINIC_OR_DEPARTMENT_OTHER)
Admission: RE | Admit: 2023-09-17 | Discharge: 2023-09-17 | Disposition: A | Discharge status: Home / Self Care | Payer: Managed Care, Other (non HMO) | Source: Ambulatory Visit | Attending: Family Medicine | Admitting: Family Medicine

## 2023-09-17 ENCOUNTER — Encounter (HOSPITAL_BASED_OUTPATIENT_CLINIC_OR_DEPARTMENT_OTHER): Payer: Self-pay

## 2023-09-17 VITALS — BP 140/81 | HR 64 | Temp 98.5°F | Resp 18

## 2023-09-17 DIAGNOSIS — B349 Viral infection, unspecified: Secondary | ICD-10-CM | POA: Diagnosis not present

## 2023-09-17 LAB — POCT INFLUENZA A/B
Influenza A, POC: NEGATIVE
Influenza B, POC: NEGATIVE

## 2023-09-17 NOTE — ED Provider Notes (Signed)
 [facility]    CSN: [id number] Arrival date & time: 09/17/23  1538      History   Chief Complaint Chief Complaint  Patient presents with   Cough   Nasal Congestion    HPI Brittany Jensen is a 58 y.o. female.    Cough Sick for 3 days symptoms include sore throat, nasal congestion, clear rhinorrhea, cough.  Her Aura ring told her she had elevated temperature for 2 days, did not take her temperature.  Denies body aches, fatigue, chest pain, shortness of breath, nausea, vomiting, diarrhea.  Admits recent travel to [employer] flew to [place]  and then drove home.  Denies confirmed COVID strep or flu exposures  Past Medical History:  Diagnosis Date   Allergy    seasonal   Anxiety    Atrial fibrillation (HCC)    On medication for it.   Cigarette smoker 05/02/2017   Dyspnea on exertion 10/19/2021   Ex-smoker 08/17/2020   Family history of brain cancer    Family history of breast cancer    Family history of lung cancer    Family history of skin cancer    Genetic testing 05/30/2020   Negative genetic testing:  No pathogenic variants detected on the [facility] Common Hereditary Cancers Panel. The report date is 05/25/2020.   The Common Hereditary Cancers Panel offered by [facility] includes sequencing and/or deletion duplication testing of the following 48 genes: APC, ATM, AXIN2, BARD1, BMPR1A, BRCA1, BRCA2, BRIP1, CDH1, CDK4, CDKN2A (p14ARF), CDKN2A (p16INK4a), CHEK2, CTNNA1, DICER1,    GERD (gastroesophageal reflux disease)    Hyperlipidemia    Mixed dyslipidemia 08/17/2020   Obesity (BMI 30.0-34.9) 08/17/2020   Paroxysmal atrial fibrillation (HCC) 05/02/2017    Patient Active Problem List   Diagnosis Date Noted   Obesity (BMI 35.0-39.9 without comorbidity) 10/02/2022   GERD (gastroesophageal reflux disease) 09/30/2022   Hyperlipidemia 09/30/2022   Dyspnea on exertion 10/19/2021   Ex-smoker 08/17/2020   Mixed dyslipidemia 08/17/2020   Allergy    Anxiety     Atrial fibrillation (HCC)    Genetic testing 05/30/2020   Family history of breast cancer    Family history of skin cancer    Family history of lung cancer    Family history of brain cancer    Paroxysmal atrial fibrillation (HCC) 05/02/2017   Cigarette smoker 05/02/2017    Past Surgical History:  Procedure Laterality Date   APPENDECTOMY     C-section     CHOLECYSTECTOMY     WRIST SURGERY      OB History   No obstetric history on file.      Home Medications    Prior to Admission medications   Medication Sig Start Date End Date Taking? Authorizing Provider  Bempedoic Acid-Ezetimibe (NEXLIZET ) 180-10 MG TABS Take 1 tablet by mouth daily in the afternoon. 07/30/23  Yes Revankar, Jennifer SAUNDERS, MD  flecainide  (TAMBOCOR ) 50 MG tablet Take 1 tablet (50 mg total) by mouth 2 (two) times daily. 06/30/23  Yes Revankar, Jennifer SAUNDERS, MD  montelukast (SINGULAIR) 10 MG tablet Take 1 tablet by mouth daily. 11/20/20  Yes [provider]  omeprazole (PRILOSEC) 40 MG capsule Take 40 mg by mouth daily.   Yes [provider]  albuterol (VENTOLIN HFA) 108 (90 Base) MCG/ACT inhaler Inhale 2 puffs into the lungs every 4 (four) hours as needed for wheezing or shortness of breath. 05/10/20   [provider]  ALPRAZolam (XANAX) 0.5 MG tablet Take 0.5 mg by mouth  every 6 (six) hours as needed for anxiety. 10/20/15   [provider]  aspirin  EC 81 MG tablet Take 1 tablet (81 mg total) by mouth daily. Swallow whole. 11/02/21   Revankar, Rajan R, MD  cyclobenzaprine (FLEXERIL) 5 MG tablet Take 5 mg by mouth 3 (three) times daily as needed for muscle spasms. 07/11/20   [provider]  ibuprofen (ADVIL,MOTRIN) 800 MG tablet Take 800 mg by mouth every 8 (eight) hours as needed for headache.    [provider]  nicotine  (NICODERM CQ  - DOSED IN MG/24 HOURS) 14 mg/24hr patch Place 1 patch (14 mg total) onto the skin daily. 11/13/21   Revankar, Jennifer SAUNDERS, MD  nitroGLYCERIN   (NITROSTAT ) 0.4 MG SL tablet Place 0.4 mg under the tongue every 5 (five) minutes as needed for chest pain.    [provider]  Vitamin D, Ergocalciferol, (DRISDOL) 1.25 MG (50000 UNIT) CAPS capsule Take 50,000 Units by mouth once a week. 03/01/20   [provider]    Family History Family History  Problem Relation Age of Onset   Atrial fibrillation Father    Squamous cell carcinoma Father        multiple   Breast cancer Mother 7       contralateral breast cancer dx. 45   Lung cancer Maternal Grandfather        dx. in his 83s, smoker   Breast cancer Paternal Grandmother        dx. younger than 74   Cirrhosis Paternal Grandfather    Breast cancer Other        dx. in her 66s, maternal great-aunt   Stroke Paternal Aunt    Brain cancer Cousin        dx. in her 29s, paternal first cousin   Colon cancer Neg Hx    Colon polyps Neg Hx    Esophageal cancer Neg Hx    Rectal cancer Neg Hx    Stomach cancer Neg Hx     Social History Social History   Tobacco Use   Smoking status: Former    Types: Cigarettes   Smokeless tobacco: Never  Vaping Use   Vaping status: Never Used  Substance Use Topics   Alcohol use: Yes    Alcohol/week: 0.0 standard drinks of alcohol    Comment: occ   Drug use: No     Allergies   Ancef [cefazolin], Biaxin [clarithromycin], Penicillins, Prednisone, and Wellbutrin [bupropion]   Review of Systems Review of Systems  Respiratory:  Positive for cough.      Physical Exam Triage Vital Signs ED Triage Vitals  Encounter Vitals Group     BP 09/17/23 1549 (!) 140/81     Systolic BP Percentile --      Diastolic BP Percentile --      Pulse Rate 09/17/23 1549 64     Resp 09/17/23 1549 18     Temp 09/17/23 1549 98.5 F (36.9 C)     Temp Source 09/17/23 1549 Oral     SpO2 09/17/23 1549 95 %     Weight --      Height --      Head Circumference --      Peak Flow --      Pain Score 09/17/23 1548 0     Pain Loc --      Pain  Education --      Exclude from Growth Chart --    No data found.  Updated Vital Signs BP ROLLEN)  140/81 (BP Location: Right Arm)   Pulse 64   Temp 98.5 F (36.9 C) (Oral)   Resp 18   SpO2 95%   Visual Acuity Right Eye Distance:   Left Eye Distance:   Bilateral Distance:    Right Eye Near:   Left Eye Near:    Bilateral Near:     Physical Exam Vitals and nursing note reviewed.  Constitutional:      Appearance: She is not ill-appearing.  HENT:     Head: Normocephalic and atraumatic.     Right Ear: Tympanic membrane and ear canal normal.     Left Ear: Tympanic membrane normal.     Nose: Congestion and rhinorrhea present.     Mouth/Throat:     Mouth: Mucous membranes are moist.     Pharynx: No oropharyngeal exudate or posterior oropharyngeal erythema.  Eyes:     General:        Right eye: No discharge.        Left eye: No discharge.     Conjunctiva/sclera: Conjunctivae normal.  Cardiovascular:     Rate and Rhythm: Normal rate and regular rhythm.     Heart sounds: Normal heart sounds.  Pulmonary:     Effort: Pulmonary effort is normal. No respiratory distress.     Breath sounds: Normal breath sounds. No wheezing, rhonchi or rales.  Musculoskeletal:     Cervical back: Neck supple.  Lymphadenopathy:     Cervical: No cervical adenopathy.  Skin:    General: Skin is warm.  Neurological:     Mental Status: She is alert and oriented to person, place, and time.      UC Treatments / Results  Labs (all labs ordered are listed, but only abnormal results are displayed) Labs Reviewed - No data to display  EKG   Radiology No results found.  Procedures Procedures (including critical care time)  Medications Ordered in UC Medications - No data to display  Initial Impression / Assessment and Plan / UC Course  I have reviewed the triage vital signs and the nursing notes.  Pertinent labs & imaging results that were available during my care of the patient were reviewed  by me and considered in my medical decision making (see chart for details).     58 year old female with nasal congestion, cough, rhinorrhea, subjective fever for 3 days, had recent travel out of state.  She is well-appearing, nontoxic, afebrile.  Has nasal congestion and clear rhinorrhea on exam otherwise exam is normal.  Lungs are clear to auscultation.  Point-of-care flu is negative.  Offered COVID PCR, decline.Dx viral syndrome, home care follow-up and warning signs reviewed with patient  Final diagnoses:  None   Discharge Instructions   None    ED Prescriptions   None    PDMP not reviewed this encounter.   Isaiah Cianci, [place] 09/17/23 1622

## 2023-09-17 NOTE — Discharge Instructions (Addendum)
 Over the counter medicines for symptoms

## 2023-09-17 NOTE — ED Triage Notes (Signed)
 Pt reports she went to [place] last week now she has sore throat, nasal congestion, cough x 3 days

## 2023-09-22 ENCOUNTER — Other Ambulatory Visit: Payer: Self-pay | Admitting: Cardiology

## 2023-09-22 DIAGNOSIS — I480 Paroxysmal atrial fibrillation: Secondary | ICD-10-CM

## 2023-09-23 ENCOUNTER — Encounter: Payer: Self-pay | Admitting: Cardiology

## 2023-09-24 ENCOUNTER — Telehealth: Payer: Self-pay | Admitting: Pharmacy Technician

## 2023-09-24 ENCOUNTER — Other Ambulatory Visit (HOSPITAL_COMMUNITY): Payer: Self-pay

## 2023-09-24 NOTE — Telephone Encounter (Signed)
 Pharmacy Patient Advocate Encounter   Received notification from Patient Advice Request messages that prior authorization for nexlizet  is required/requested.   Insurance verification completed.   The patient is insured through [employer] .   Per test claim: PA required; PA submitted to above mentioned insurance via CoverMyMeds Key/confirmation #/EOC [facility] Status is pending

## 2023-09-29 ENCOUNTER — Other Ambulatory Visit: Payer: Self-pay

## 2023-09-29 ENCOUNTER — Ambulatory Visit: Payer: Managed Care, Other (non HMO) | Attending: Cardiology | Admitting: Cardiology

## 2023-09-29 ENCOUNTER — Other Ambulatory Visit (HOSPITAL_COMMUNITY): Payer: Self-pay

## 2023-09-29 VITALS — BP 126/68 | HR 61 | Ht 69.6 in | Wt 226.2 lb

## 2023-09-29 DIAGNOSIS — E785 Hyperlipidemia, unspecified: Secondary | ICD-10-CM

## 2023-09-29 DIAGNOSIS — I480 Paroxysmal atrial fibrillation: Secondary | ICD-10-CM | POA: Diagnosis not present

## 2023-09-29 DIAGNOSIS — R931 Abnormal findings on diagnostic imaging of heart and coronary circulation: Secondary | ICD-10-CM | POA: Insufficient documentation

## 2023-09-29 DIAGNOSIS — E66811 Obesity, class 1: Secondary | ICD-10-CM | POA: Insufficient documentation

## 2023-09-29 HISTORY — DX: Abnormal findings on diagnostic imaging of heart and coronary circulation: R93.1

## 2023-09-29 NOTE — Progress Notes (Signed)
 Cardiology Office Note:    Date:  09/29/2023   ID:  Brittany Jensen, DOB Feb 08, 1966, MRN [id number]  PCP:  Erick Greig LABOR, NP  Cardiologist:  Jennifer JONELLE Crape, MD   Referring MD: Erick Greig LABOR, NP    ASSESSMENT:    1. Hyperlipidemia, unspecified hyperlipidemia type   2. Paroxysmal atrial fibrillation (HCC)    PLAN:    In order of problems listed above:   Elevated calcium  score: Secondary prevention stressed with the patient.  Importance of compliance with diet medication stressed and she does this very meticulously and is happy about it.  She exercises very regularly and have been impressed with the exercise program. Paroxysmal atrial fibrillation: On antiarrhythmic therapy tolerating well.  EKG reviewed. Obesity and weight reduction stressed diet emphasized and she promises to do better she has done a wonderful job with diet and excise. Ex-smoker: Quit smoking and promises never to get back. Patient will be seen in follow-up appointment in 12 months or earlier if the patient has any concerns.    Medication Adjustments/Labs and Tests Ordered: Current medicines are reviewed at length with the patient today.  Concerns regarding medicines are outlined above.  Orders Placed This Encounter  Procedures   EKG 12-Lead   No orders of the defined types were placed in this encounter.    No chief complaint on file.    History of Present Illness:    Brittany Jensen is a 58 y.o. female.  Patient has past medical history of elevated calcium  score, and mixed dyslipidemia.  She denies any problems at this time and takes care of activities of daily living.  No chest pain orthopnea or PND.  At the time of my evaluation, the patient is alert awake oriented and in no distress. Past Medical History:  Diagnosis Date   Allergy    seasonal   Anxiety    Atrial fibrillation (HCC)    On medication for it.   Cigarette smoker 05/02/2017   Dyspnea on exertion 10/19/2021   Ex-smoker  08/17/2020   Family history of brain cancer    Family history of breast cancer    Family history of lung cancer    Family history of skin cancer    Genetic testing 05/30/2020   Negative genetic testing:  No pathogenic variants detected on the [facility] Common Hereditary Cancers Panel. The report date is 05/25/2020.   The Common Hereditary Cancers Panel offered by [facility] includes sequencing and/or deletion duplication testing of the following 48 genes: APC, ATM, AXIN2, BARD1, BMPR1A, BRCA1, BRCA2, BRIP1, CDH1, CDK4, CDKN2A (p14ARF), CDKN2A (p16INK4a), CHEK2, CTNNA1, DICER1,    GERD (gastroesophageal reflux disease)    Hyperlipidemia    Mixed dyslipidemia 08/17/2020   Obesity (BMI 30.0-34.9) 08/17/2020   Obesity (BMI 35.0-39.9 without comorbidity) 10/02/2022   Paroxysmal atrial fibrillation (HCC) 05/02/2017    Past Surgical History:  Procedure Laterality Date   APPENDECTOMY     C-section     CHOLECYSTECTOMY     WRIST SURGERY      Current Medications: Current Meds  Medication Sig   ALPRAZolam (XANAX) 0.5 MG tablet Take 0.5 mg by mouth every 6 (six) hours as needed for anxiety.   aspirin  EC 81 MG tablet Take 1 tablet (81 mg total) by mouth daily. Swallow whole.   Bempedoic Acid-Ezetimibe (NEXLIZET ) 180-10 MG TABS Take 1 tablet by mouth daily in the afternoon. (Patient taking differently: Take 1 tablet by mouth every other day.)   flecainide  (TAMBOCOR ) 50 MG tablet  Take 1 tablet (50 mg total) by mouth 2 (two) times daily.   ibuprofen (ADVIL,MOTRIN) 800 MG tablet Take 800 mg by mouth every 8 (eight) hours as needed for headache.   montelukast (SINGULAIR) 10 MG tablet Take 1 tablet by mouth daily.   nitroGLYCERIN  (NITROSTAT ) 0.4 MG SL tablet Place 0.4 mg under the tongue every 5 (five) minutes as needed for chest pain.   omeprazole (PRILOSEC) 40 MG capsule Take 40 mg by mouth daily.   Vitamin D, Ergocalciferol, (DRISDOL) 1.25 MG (50000 UNIT) CAPS capsule Take 50,000 Units by mouth once a  week.     Allergies:   Ancef [cefazolin], Biaxin [clarithromycin], Penicillins, Prednisone, and Wellbutrin [bupropion]   Social History   Socioeconomic History   Marital status: Married    Spouse name: Not on file   Number of children: Not on file   Years of education: Not on file   Highest education level: Not on file  Occupational History   Not on file  Tobacco Use   Smoking status: Former    Types: Cigarettes   Smokeless tobacco: Never  Vaping Use   Vaping status: Never Used  Substance and Sexual Activity   Alcohol use: Yes    Alcohol/week: 0.0 standard drinks of alcohol    Comment: occ   Drug use: No   Sexual activity: Not on file  Other Topics Concern   Not on file  Social History Narrative   Not on file   Social Drivers of Health   Financial Resource Strain: Not on file  Food Insecurity: Not on file  Transportation Needs: Not on file  Physical Activity: Not on file  Stress: Not on file  Social Connections: Not on file     Family History: The patient's family history includes Atrial fibrillation in her father; Brain cancer in her cousin; Breast cancer in her paternal grandmother and another family member; Breast cancer (age of onset: 29) in her mother; Cirrhosis in her paternal grandfather; Lung cancer in her maternal grandfather; Squamous cell carcinoma in her father; Stroke in her paternal aunt. There is no history of Colon cancer, Colon polyps, Esophageal cancer, Rectal cancer, or Stomach cancer.  ROS:   Please see the history of present illness.    All other systems reviewed and are negative.  EKGs/Labs/Other Studies Reviewed:    The following studies were reviewed today:  EKG Interpretation Date/Time:  Monday September 29 2023 12:42:03 EST Ventricular Rate:  55 PR Interval:  144 QRS Duration:  92 QT Interval:  426 QTC Calculation: 407 R Axis:   56  Text Interpretation: Sinus bradycardia Otherwise normal ECG When compared with ECG of 27-Jan-2004  08:33, Vent. rate has decreased BY  43 BPM T wave amplitude has decreased in Anterior leads Confirmed by Edwyna Backers [id number] on 09/29/2023 12:55:07 PM     Recent Labs: 04/28/2023: ALT 30  Recent Lipid Panel    Component Value Date/Time   CHOL 133 04/28/2023 0817   TRIG 96 04/28/2023 0817   HDL 57 04/28/2023 0817   CHOLHDL 2.3 04/28/2023 0817   LDLCALC 58 04/28/2023 0817    Physical Exam:    VS:  BP 126/68   Pulse 61   Ht 5' 9.6 (1.768 m)   Wt 226 lb 3.2 oz (102.6 kg)   SpO2 99%   BMI 32.83 kg/m     Wt Readings from Last 3 Encounters:  09/29/23 226 lb 3.2 oz (102.6 kg)  10/02/22 255 lb (115.7 kg)  08/26/22 240  lb (108.9 kg)     GEN: Patient is in no acute distress HEENT: Normal NECK: No JVD; No carotid bruits LYMPHATICS: No lymphadenopathy CARDIAC: Hear sounds regular, 2/6 systolic murmur at the apex. RESPIRATORY:  Clear to auscultation without rales, wheezing or rhonchi  ABDOMEN: Soft, non-tender, non-distended MUSCULOSKELETAL:  No edema; No deformity  SKIN: Warm and dry NEUROLOGIC:  Alert and oriented x 3 PSYCHIATRIC:  Normal affect   Signed, Jennifer JONELLE Crape, MD  09/29/2023 1:17 PM    [facility]

## 2023-09-29 NOTE — Patient Instructions (Signed)

## 2023-09-30 ENCOUNTER — Other Ambulatory Visit (HOSPITAL_COMMUNITY): Payer: Self-pay

## 2023-10-01 ENCOUNTER — Other Ambulatory Visit (HOSPITAL_COMMUNITY): Payer: Self-pay

## 2023-10-01 NOTE — Telephone Encounter (Signed)
 Additional info sent

## 2023-10-02 NOTE — Telephone Encounter (Signed)
Please do a PA for Nexlizet.

## 2023-10-06 ENCOUNTER — Other Ambulatory Visit (HOSPITAL_COMMUNITY): Payer: Self-pay

## 2023-10-07 ENCOUNTER — Encounter: Payer: Self-pay | Admitting: Cardiology

## 2023-10-07 DIAGNOSIS — E782 Mixed hyperlipidemia: Secondary | ICD-10-CM

## 2023-10-08 ENCOUNTER — Other Ambulatory Visit (HOSPITAL_COMMUNITY): Payer: Self-pay

## 2023-10-09 NOTE — Telephone Encounter (Signed)
Still pending

## 2023-10-14 ENCOUNTER — Other Ambulatory Visit (HOSPITAL_COMMUNITY): Payer: Self-pay

## 2023-10-15 NOTE — Telephone Encounter (Signed)
Pharmacy Patient Advocate Encounter  Received notification from [employer] that Prior Authorization for nexlizet has been DENIED.  Full denial letter will be attached to media   PA #/Case ID/Reference #: [id number]

## 2023-10-20 MED ORDER — NEXLIZET 180-10 MG PO TABS: 1.0000 | ORAL_TABLET | Freq: Every day | ORAL | Status: DC

## 2023-10-20 NOTE — Addendum Note (Signed)
Addended by: Bralynn Donado, Elmarie Shiley L on: 10/20/2023 05:04 PM   Modules accepted: Orders

## 2023-11-11 NOTE — Addendum Note (Signed)
 Addended by: Eleonore Chiquito on: 11/11/2023 09:52 AM   Modules accepted: Orders

## 2023-11-12 ENCOUNTER — Other Ambulatory Visit: Payer: Self-pay

## 2023-11-12 DIAGNOSIS — I480 Paroxysmal atrial fibrillation: Secondary | ICD-10-CM

## 2023-11-12 MED ORDER — FLECAINIDE ACETATE 50 MG PO TABS: 50.0000 mg | ORAL_TABLET | Freq: Two times a day (BID) | ORAL | 2 refills | Status: DC

## 2023-11-17 ENCOUNTER — Encounter: Payer: Self-pay | Admitting: Cardiology

## 2023-11-17 DIAGNOSIS — E785 Hyperlipidemia, unspecified: Secondary | ICD-10-CM

## 2023-11-17 DIAGNOSIS — E782 Mixed hyperlipidemia: Secondary | ICD-10-CM

## 2023-11-17 DIAGNOSIS — R931 Abnormal findings on diagnostic imaging of heart and coronary circulation: Secondary | ICD-10-CM

## 2023-11-20 MED ORDER — PITAVASTATIN CALCIUM 2 MG PO TABS: 2.0000 mg | ORAL_TABLET | Freq: Every day | ORAL | 3 refills | Status: DC

## 2023-11-29 ENCOUNTER — Encounter (HOSPITAL_BASED_OUTPATIENT_CLINIC_OR_DEPARTMENT_OTHER): Payer: Self-pay | Admitting: Emergency Medicine

## 2023-11-29 ENCOUNTER — Other Ambulatory Visit: Payer: Self-pay

## 2023-11-29 ENCOUNTER — Emergency Department (HOSPITAL_BASED_OUTPATIENT_CLINIC_OR_DEPARTMENT_OTHER)
Admission: EM | Admit: 2023-11-29 | Discharge: 2023-11-29 | Disposition: A | Discharge status: Home / Self Care | Attending: Emergency Medicine | Admitting: Emergency Medicine

## 2023-11-29 DIAGNOSIS — I480 Paroxysmal atrial fibrillation: Secondary | ICD-10-CM | POA: Diagnosis present

## 2023-11-29 DIAGNOSIS — Z79899 Other long term (current) drug therapy: Secondary | ICD-10-CM | POA: Insufficient documentation

## 2023-11-29 DIAGNOSIS — Z7982 Long term (current) use of aspirin: Secondary | ICD-10-CM | POA: Diagnosis not present

## 2023-11-29 LAB — CBC WITH DIFFERENTIAL/PLATELET
Abs Immature Granulocytes: 0.03 10*3/uL (ref 0.00–0.07)
Basophils Absolute: 0 10*3/uL (ref 0.0–0.1)
Basophils Relative: 1 %
Eosinophils Absolute: 0.2 10*3/uL (ref 0.0–0.5)
Eosinophils Relative: 3 %
HCT: 43.1 % (ref 36.0–46.0)
Hemoglobin: 15.1 g/dL — ABNORMAL HIGH (ref 12.0–15.0)
Immature Granulocytes: 1 %
Lymphocytes Relative: 42 %
Lymphs Abs: 2.7 10*3/uL (ref 0.7–4.0)
MCH: 31.3 pg (ref 26.0–34.0)
MCHC: 35 g/dL (ref 30.0–36.0)
MCV: 89.2 fL (ref 80.0–100.0)
Monocytes Absolute: 0.5 10*3/uL (ref 0.1–1.0)
Monocytes Relative: 7 %
Neutro Abs: 3 10*3/uL (ref 1.7–7.7)
Neutrophils Relative %: 46 %
Platelets: 256 10*3/uL (ref 150–400)
RBC: 4.83 MIL/uL (ref 3.87–5.11)
RDW: 12.5 % (ref 11.5–15.5)
WBC: 6.4 10*3/uL (ref 4.0–10.5)
nRBC: 0 % (ref 0.0–0.2)

## 2023-11-29 LAB — COMPREHENSIVE METABOLIC PANEL
ALT: 41 U/L (ref 0–44)
AST: 34 U/L (ref 15–41)
Albumin: 4.2 g/dL (ref 3.5–5.0)
Alkaline Phosphatase: 47 U/L (ref 38–126)
Anion gap: 10 (ref 5–15)
BUN: 14 mg/dL (ref 6–20)
CO2: 26 mmol/L (ref 22–32)
Calcium: 9.3 mg/dL (ref 8.9–10.3)
Chloride: 106 mmol/L (ref 98–111)
Creatinine, Ser: 0.72 mg/dL (ref 0.44–1.00)
GFR, Estimated: >60 mL/min (ref >60)
Glucose, Bld: 116 mg/dL — ABNORMAL HIGH (ref 70–99)
Potassium: 3.9 mmol/L (ref 3.5–5.1)
Sodium: 142 mmol/L (ref 135–145)
Total Bilirubin: 0.5 mg/dL (ref 0.0–1.2)
Total Protein: 7.3 g/dL (ref 6.5–8.1)

## 2023-11-29 MED ORDER — LACTATED RINGERS IV BOLUS
1000.0000 mL | Freq: Once | INTRAVENOUS | Status: AC
  Administered 2023-11-29: 1000 mL via INTRAVENOUS

## 2023-11-29 NOTE — ED Triage Notes (Signed)
 States her heart was beating real fast at times since last night. Denies CP or SOB

## 2023-11-29 NOTE — Discharge Instructions (Addendum)
 While you are in the emergency room, your blood work done that was normal.  A few things that can exacerbate atrial fibrillation include dehydration, lack of sleep, alcohol, nicotine.  Sometimes, people can need to go up on their medications over time.  Please make a follow-up appointment with your cardiologist to discuss your symptoms.  Return to the emergency room if you have repeat episodes of atrial fibrillation with a high heart rate.

## 2023-11-29 NOTE — ED Provider Notes (Signed)
 [place] EMERGENCY DEPARTMENT AT [facility] [place] Provider Note   CSN: [id number] Arrival date & time: 11/29/23  7829     History  No chief complaint on file.   Brittany Jensen is a 58 y.o. female.  58 year old female with a history of paroxysmal A-fib, on flecainide, not on blood thinners here today for episodes of A-fib last evening.  Patient reports that last evening she felt as though her heart was beating quickly, noticed it on her smart watch with rates in the 130s.  She has been compliant with her flecainide.  Patient does state that she was at the beach this past week, and was consuming a bit more alcohol than her baseline, and also was smoking cigarettes which she had recently quit.  She denies feeling as though her heart is beating quickly at this time.  She has not had chest pain.        Home Medications Prior to Admission medications   Medication Sig Start Date End Date Taking? Authorizing Provider  ALPRAZolam Prudy Feeler) 0.5 MG tablet Take 0.5 mg by mouth every 6 (six) hours as needed for anxiety. 10/20/15   [provider]  aspirin EC 81 MG tablet Take 1 tablet (81 mg total) by mouth daily. Swallow whole. 11/02/21   Revankar, Aundra Dubin, MD  Bempedoic Acid-Ezetimibe (NEXLIZET) 180-10 MG TABS Take 1 tablet by mouth daily in the afternoon. Patient taking differently: Take 1 tablet by mouth every other day. 07/30/23   Revankar, Aundra Dubin, MD  Bempedoic Acid-Ezetimibe (NEXLIZET) 180-10 MG TABS Take 1 tablet by mouth daily. 10/20/23   Revankar, Aundra Dubin, MD  flecainide (TAMBOCOR) 50 MG tablet Take 1 tablet (50 mg total) by mouth 2 (two) times daily. 11/12/23   Revankar, Aundra Dubin, MD  ibuprofen (ADVIL,MOTRIN) 800 MG tablet Take 800 mg by mouth every 8 (eight) hours as needed for headache.    [provider]  montelukast (SINGULAIR) 10 MG tablet Take 1 tablet by mouth daily. 11/20/20   [provider]  nitroGLYCERIN (NITROSTAT) 0.4 MG SL tablet Place  0.4 mg under the tongue every 5 (five) minutes as needed for chest pain.    [provider]  omeprazole (PRILOSEC) 40 MG capsule Take 40 mg by mouth daily.    [provider]  Pitavastatin Calcium 2 MG TABS Take 1 tablet (2 mg total) by mouth daily. 11/20/23   Revankar, Aundra Dubin, MD  Vitamin D, Ergocalciferol, (DRISDOL) 1.25 MG (50000 UNIT) CAPS capsule Take 50,000 Units by mouth once a week. 03/01/20   [provider]      Allergies    Ancef [cefazolin], Biaxin [clarithromycin], Penicillins, Prednisone, and Wellbutrin [bupropion]    Review of Systems   Review of Systems  Physical Exam Updated Vital Signs BP 105/72 (BP Location: Left Arm)   Pulse 88   Temp 98.3 F (36.8 C) (Oral)   Resp 16   Ht 5\' 9"  (1.753 m)   Wt 101.6 kg   LMP 02/13/2020 (Approximate)   SpO2 99%   BMI 33.08 kg/m  Physical Exam Constitutional:      Appearance: She is not toxic-appearing.  HENT:     Head: Normocephalic.  Eyes:     Pupils: Pupils are equal, round, and reactive to light.  Cardiovascular:     Rate and Rhythm: Normal rate.  Pulmonary:     Effort: Pulmonary effort is normal.     Breath sounds: Normal breath sounds.  Neurological:     General:  No focal deficit present.     Mental Status: She is alert.     ED Results / Procedures / Treatments   Labs (all labs ordered are listed, but only abnormal results are displayed) Labs Reviewed  COMPREHENSIVE METABOLIC PANEL - Abnormal; Notable for the following components:      Result Value   Glucose, Bld 116 (*)    All other components within normal limits  CBC WITH DIFFERENTIAL/PLATELET - Abnormal; Notable for the following components:   Hemoglobin 15.1 (*)    All other components within normal limits    EKG EKG Interpretation Date/Time:  Saturday November 29 2023 07:11:05 EDT Ventricular Rate:  84 PR Interval:  130 QRS Duration:  98 QT Interval:  373 QTC Calculation: 441 R Axis:   80  Text Interpretation: Sinus  rhythm Low voltage, precordial leads Confirmed by Anders Simmonds [id number] on 11/29/2023 8:32:19 AM  Radiology No results found.  Procedures Procedures    Medications Ordered in ED Medications  lactated ringers bolus 1,000 mL (1,000 mLs Intravenous New Bag/Given 11/29/23 0810)    ED Course/ Medical Decision Making/ A&P                                 Medical Decision Making 58 year old female here today for episode of fast heart rate which has since resolved.  Plan-patient wears a smart watch, based on her history, likely was having episodes of A-fib.  Currently in normal sinus rhythm.  My independent review of the patient's EKG shows sinus rhythm, no ST segment depressions or elevations, no T wave inversions, no evidence of acute ischemia.  Patient overall looks well.  I do suspect that some of the activities over the weekend could have contributed to her elevated heart rate.  I have lower suspicion for ACS and will defer troponin testing.  I reviewed the patient's most recent cardiology note.  Will provide the patient with some fluids.  CBC and CMP ordered.  Reassessment 8:50 AM-patient's CBC shows no anemia.  Normal renal function, normal electrolytes.  Patient has been our emergency room for more than 1 hour, has remained in normal sinus rhythm.  Will discharge patient, have her follow-up with her cardiologist.  I counseled the patient on nicotine and alcohol use reduction.  Amount and/or Complexity of Data Reviewed Labs: ordered.           Final Clinical Impression(s) / ED Diagnoses Final diagnoses:  Paroxysmal atrial fibrillation [facility])    Rx / DC Orders ED Discharge Orders     None         Arletha Pili, DO 11/29/23 [phone]

## 2024-02-23 ENCOUNTER — Other Ambulatory Visit (HOSPITAL_COMMUNITY): Payer: Self-pay

## 2024-02-23 ENCOUNTER — Telehealth: Payer: Self-pay | Admitting: Pharmacy Technician

## 2024-02-23 NOTE — Telephone Encounter (Signed)
 Received cmm from renewals to do pa for nexlizet  180/10mg . Tried to do prior authorization in cover my meds but kept saying patient inactive. But per test claim in wam, [employer] is active just needs prior authorization. I called the insurance. They said this was denied in January stating:. They said this was appealed under case #[id number]. Last note said she needs lipids done. Labs still active from 11/20/23 and not completed yet. Spoke to appeals representative and they said the original appeal was denied. They said we can appeal again but would be the 2nd level appeal. Insurance is sending me the original appeal fax so we have the information to send for 2nd appeal.    Found a note that she was changed to pitavastatin 

## 2024-03-06 LAB — HEMOGLOBIN A1C: A1c: 5.8

## 2024-03-06 LAB — COMPREHENSIVE METABOLIC PANEL WITH GFR: EGFR: 103

## 2024-03-08 ENCOUNTER — Other Ambulatory Visit (HOSPITAL_BASED_OUTPATIENT_CLINIC_OR_DEPARTMENT_OTHER): Payer: Self-pay | Admitting: Internal Medicine

## 2024-03-08 DIAGNOSIS — Z1231 Encounter for screening mammogram for malignant neoplasm of breast: Secondary | ICD-10-CM

## 2024-03-08 DIAGNOSIS — Z87891 Personal history of nicotine dependence: Secondary | ICD-10-CM

## 2024-03-12 ENCOUNTER — Ambulatory Visit (HOSPITAL_BASED_OUTPATIENT_CLINIC_OR_DEPARTMENT_OTHER)
Admission: RE | Admit: 2024-03-12 | Discharge: 2024-03-12 | Disposition: A | Discharge status: Home / Self Care | Source: Ambulatory Visit | Attending: Internal Medicine | Admitting: Internal Medicine

## 2024-03-12 ENCOUNTER — Encounter (HOSPITAL_BASED_OUTPATIENT_CLINIC_OR_DEPARTMENT_OTHER): Payer: Self-pay | Admitting: Radiology

## 2024-03-12 DIAGNOSIS — Z1231 Encounter for screening mammogram for malignant neoplasm of breast: Secondary | ICD-10-CM | POA: Diagnosis not present

## 2024-03-15 ENCOUNTER — Ambulatory Visit (HOSPITAL_BASED_OUTPATIENT_CLINIC_OR_DEPARTMENT_OTHER)
Admission: EM | Admit: 2024-03-15 | Discharge: 2024-03-15 | Disposition: A | Discharge status: Home / Self Care | Attending: Family Medicine | Admitting: Family Medicine

## 2024-03-15 ENCOUNTER — Ambulatory Visit (INDEPENDENT_AMBULATORY_CARE_PROVIDER_SITE_OTHER)
Admit: 2024-03-15 | Discharge: 2024-03-15 | Disposition: A | Discharge status: Home / Self Care | Attending: Family Medicine | Admitting: Family Medicine

## 2024-03-15 ENCOUNTER — Encounter (HOSPITAL_BASED_OUTPATIENT_CLINIC_OR_DEPARTMENT_OTHER): Payer: Self-pay

## 2024-03-15 DIAGNOSIS — M25561 Pain in right knee: Secondary | ICD-10-CM

## 2024-03-15 DIAGNOSIS — S8991XA Unspecified injury of right lower leg, initial encounter: Secondary | ICD-10-CM | POA: Diagnosis not present

## 2024-03-15 DIAGNOSIS — W19XXXA Unspecified fall, initial encounter: Secondary | ICD-10-CM | POA: Diagnosis not present

## 2024-03-15 NOTE — Discharge Instructions (Signed)
 Your x-ray was normal.  Try to rest the knee is much as possible, elevate and ice.  Keep clean to avoid infection. Follow-up as needed

## 2024-03-15 NOTE — ED Triage Notes (Signed)
 Pt states she was walking this morning on the road and when she fell. She fell on her right leg and right hand. After the fall she did wash the lesion. She has not taken any meds.

## 2024-03-15 NOTE — ED Provider Notes (Signed)
 [facility]    CSN: [id number] Arrival date & time: 03/15/24  0800      History   Chief Complaint Chief Complaint  Patient presents with   Fall    HPI Brittany Jensen is a 58 y.o. female.   58 year old female presents today for follow-up.  She had a fall earlier this morning when walking.  Reported she just tripped and fell landing on the concrete.  She injured the right knee and the right hand.  She has abrasions to the right knee.  She is able to ambulate.  Having some pain laterally to the right knee.   Fall    Past Medical History:  Diagnosis Date   Allergy    seasonal   Anxiety    Atrial fibrillation (HCC)    On medication for it.   Cigarette smoker 05/02/2017   Dyspnea on exertion 10/19/2021   Ex-smoker 08/17/2020   Family history of brain cancer    Family history of breast cancer    Family history of lung cancer    Family history of skin cancer    Genetic testing 05/30/2020   Negative genetic testing:  No pathogenic variants detected on the [facility] Common Hereditary Cancers Panel. The report date is 05/25/2020.   The Common Hereditary Cancers Panel offered by [facility] includes sequencing and/or deletion duplication testing of the following 48 genes: APC, ATM, AXIN2, BARD1, BMPR1A, BRCA1, BRCA2, BRIP1, CDH1, CDK4, CDKN2A (p14ARF), CDKN2A (p16INK4a), CHEK2, CTNNA1, DICER1,    GERD (gastroesophageal reflux disease)    Hyperlipidemia    Mixed dyslipidemia 08/17/2020   Obesity (BMI 30.0-34.9) 08/17/2020   Obesity (BMI 35.0-39.9 without comorbidity) 10/02/2022   Paroxysmal atrial fibrillation (HCC) 05/02/2017    Patient Active Problem List   Diagnosis Date Noted   Elevated coronary artery calcium  score 09/29/2023   Obesity (BMI 30.0-34.9) 09/29/2023   Obesity (BMI 35.0-39.9 without comorbidity) 10/02/2022   GERD (gastroesophageal reflux disease) 09/30/2022   Hyperlipidemia 09/30/2022   Dyspnea on exertion 10/19/2021   Ex-smoker 08/17/2020    Mixed dyslipidemia 08/17/2020   Allergy    Anxiety    Atrial fibrillation (HCC)    Genetic testing 05/30/2020   Family history of breast cancer    Family history of skin cancer    Family history of lung cancer    Family history of brain cancer    Paroxysmal atrial fibrillation (HCC) 05/02/2017   Cigarette smoker 05/02/2017    Past Surgical History:  Procedure Laterality Date   APPENDECTOMY     C-section     CHOLECYSTECTOMY     WRIST SURGERY      OB History   No obstetric history on file.      Home Medications    Prior to Admission medications   Medication Sig Start Date End Date Taking? Authorizing Provider  ALPRAZolam (XANAX) 0.5 MG tablet Take 0.5 mg by mouth every 6 (six) hours as needed for anxiety. 10/20/15   [provider]  aspirin  EC 81 MG tablet Take 1 tablet (81 mg total) by mouth daily. Swallow whole. 11/02/21   Revankar, Jennifer SAUNDERS, MD  Bempedoic Acid-Ezetimibe (NEXLIZET ) 180-10 MG TABS Take 1 tablet by mouth daily. 10/20/23   Revankar, Jennifer SAUNDERS, MD  flecainide  (TAMBOCOR ) 50 MG tablet Take 1 tablet (50 mg total) by mouth 2 (two) times daily. 11/12/23   Revankar, Rajan R, MD  ibuprofen (ADVIL,MOTRIN) 800 MG tablet Take 800 mg by mouth every 8 (eight) hours as needed for headache.  [provider]  montelukast (SINGULAIR) 10 MG tablet Take 1 tablet by mouth daily. 11/20/20   [provider]  nitroGLYCERIN  (NITROSTAT ) 0.4 MG SL tablet Place 0.4 mg under the tongue every 5 (five) minutes as needed for chest pain.    [provider]  omeprazole (PRILOSEC) 40 MG capsule Take 40 mg by mouth daily.    [provider]  Pitavastatin  Calcium  2 MG TABS Take 1 tablet (2 mg total) by mouth daily. 11/20/23   Revankar, Rajan R, MD  Vitamin D, Ergocalciferol, (DRISDOL) 1.25 MG (50000 UNIT) CAPS capsule Take 50,000 Units by mouth once a week. 03/01/20   [provider]    Family History Family History  Problem Relation Age of Onset    Atrial fibrillation Father    Squamous cell carcinoma Father        multiple   Breast cancer Mother 21       contralateral breast cancer dx. 59   Lung cancer Maternal Grandfather        dx. in his 9s, smoker   Breast cancer Paternal Grandmother        dx. younger than 59   Cirrhosis Paternal Grandfather    Breast cancer Other        dx. in her 22s, maternal great-aunt   Stroke Paternal Aunt    Brain cancer Cousin        dx. in her 85s, paternal first cousin   Colon cancer Neg Hx    Colon polyps Neg Hx    Esophageal cancer Neg Hx    Rectal cancer Neg Hx    Stomach cancer Neg Hx     Social History Social History   Tobacco Use   Smoking status: Former    Types: Cigarettes   Smokeless tobacco: Never  Vaping Use   Vaping status: Never Used  Substance Use Topics   Alcohol use: Yes    Alcohol/week: 0.0 standard drinks of alcohol    Comment: occ   Drug use: No     Allergies   Ancef [cefazolin], Biaxin [clarithromycin], Penicillins, Prednisone, and Wellbutrin [bupropion]   Review of Systems Review of Systems See HPI  Physical Exam Triage Vital Signs ED Triage Vitals  Encounter Vitals Group     BP 03/15/24 0820 (!) 141/79     Girls Systolic BP Percentile --      Girls Diastolic BP Percentile --      Boys Systolic BP Percentile --      Boys Diastolic BP Percentile --      Pulse Rate 03/15/24 0820 69     Resp 03/15/24 0820 20     Temp 03/15/24 0820 98.4 F (36.9 C)     Temp Source 03/15/24 0820 Oral     SpO2 03/15/24 0820 95 %     Weight --      Height --      Head Circumference --      Peak Flow --      Pain Score 03/15/24 0817 7     Pain Loc --      Pain Education --      Exclude from Growth Chart --    No data found.  Updated Vital Signs BP (!) 141/79 (BP Location: Right Arm)   Pulse 69   Temp 98.4 F (36.9 C) (Oral)   Resp 20   LMP 02/13/2020 (Approximate)   SpO2 95%   Visual Acuity Right Eye Distance:   Left Eye Distance:  Bilateral  Distance:    Right Eye Near:   Left Eye Near:    Bilateral Near:     Physical Exam Vitals and nursing note reviewed.  Constitutional:      General: She is not in acute distress.    Appearance: Normal appearance. She is not ill-appearing, toxic-appearing or diaphoretic.  Pulmonary:     Effort: Pulmonary effort is normal.   Musculoskeletal:     Comments: Multiple abrasions to right knee with bleeding controlled.  Pain and mild swelling laterally to the right knee. Abrasions to palm of right hand without swelling but mild bruising. Normal range of motion   Neurological:     Mental Status: She is alert.   Psychiatric:        Mood and Affect: Mood normal.      UC Treatments / Results  Labs (all labs ordered are listed, but only abnormal results are displayed) Labs Reviewed - No data to display  EKG   Radiology DG Knee Complete 4 Views Right Result Date: 03/15/2024 CLINICAL DATA:  Fall. EXAM: RIGHT KNEE - COMPLETE 4+ VIEW COMPARISON:  None Available. FINDINGS: No evidence of fracture, dislocation, or joint effusion. No evidence of arthropathy or other focal bone abnormality. Soft tissues are unremarkable. IMPRESSION: Negative. Electronically Signed   By: Camellia Candle M.D.   On: 03/15/2024 09:08    Procedures Procedures (including critical care time)  Medications Ordered in UC Medications - No data to display  Initial Impression / Assessment and Plan / UC Course  I have reviewed the triage vital signs and the nursing notes.  Pertinent labs & imaging results that were available during my care of the patient were reviewed by me and considered in my medical decision making (see chart for details).     Fall- x ray without any fractures. Cleaned wounds here and applied dressing.  Recommend ice the area and rest. Keep the wounds clean.  Follow up as needed.  Final Clinical Impressions(s) / UC Diagnoses   Final diagnoses:  Fall, initial encounter  Injury of right knee,  initial encounter     Discharge Instructions      Your x-ray was normal.  Try to rest the knee is much as possible, elevate and ice.  Keep clean to avoid infection. Follow-up as needed     ED Prescriptions   None    PDMP not reviewed this encounter.   Adah Wilbert LABOR, FNP 03/15/24 [phone]

## 2024-03-24 ENCOUNTER — Inpatient Hospital Stay (HOSPITAL_BASED_OUTPATIENT_CLINIC_OR_DEPARTMENT_OTHER): Admission: RE | Admit: 2024-03-24 | Source: Ambulatory Visit | Admitting: Radiology

## 2024-03-26 ENCOUNTER — Ambulatory Visit (INDEPENDENT_AMBULATORY_CARE_PROVIDER_SITE_OTHER)
Admission: RE | Admit: 2024-03-26 | Discharge: 2024-03-26 | Disposition: A | Discharge status: Home / Self Care | Source: Ambulatory Visit | Attending: Internal Medicine | Admitting: Internal Medicine

## 2024-03-26 DIAGNOSIS — Z87891 Personal history of nicotine dependence: Secondary | ICD-10-CM | POA: Diagnosis not present

## 2024-03-31 ENCOUNTER — Other Ambulatory Visit: Payer: Self-pay | Admitting: Internal Medicine

## 2024-03-31 DIAGNOSIS — R928 Other abnormal and inconclusive findings on diagnostic imaging of breast: Secondary | ICD-10-CM

## 2024-04-06 ENCOUNTER — Ambulatory Visit
Admission: RE | Admit: 2024-04-06 | Discharge: 2024-04-06 | Disposition: A | Discharge status: Home / Self Care | Source: Ambulatory Visit | Attending: Internal Medicine | Admitting: Internal Medicine

## 2024-04-06 ENCOUNTER — Ambulatory Visit

## 2024-04-06 DIAGNOSIS — R928 Other abnormal and inconclusive findings on diagnostic imaging of breast: Secondary | ICD-10-CM

## 2024-04-23 ENCOUNTER — Encounter: Payer: Self-pay | Admitting: Cardiology

## 2024-04-23 MED ORDER — NITROGLYCERIN 0.4 MG SL SUBL: 0.4000 mg | SUBLINGUAL_TABLET | SUBLINGUAL | 6 refills | Status: AC | PRN

## 2024-06-17 ENCOUNTER — Other Ambulatory Visit: Payer: Self-pay

## 2024-06-17 MED ORDER — PITAVASTATIN CALCIUM 2 MG PO TABS: 2.0000 mg | ORAL_TABLET | Freq: Every day | ORAL | 0 refills | Status: DC

## 2024-07-09 ENCOUNTER — Other Ambulatory Visit (HOSPITAL_BASED_OUTPATIENT_CLINIC_OR_DEPARTMENT_OTHER): Payer: Self-pay

## 2024-07-09 MED ORDER — FLUBLOK 0.5 ML IM SOSY
0.5000 mL | PREFILLED_SYRINGE | Freq: Once | INTRAMUSCULAR | 0 refills | Status: AC
  Filled 2024-07-09: qty 0.5, 1d supply, fill #0

## 2024-10-01 ENCOUNTER — Ambulatory Visit: Admitting: Cardiology

## 2024-10-13 ENCOUNTER — Encounter: Payer: Self-pay | Admitting: Cardiology

## 2024-10-13 ENCOUNTER — Ambulatory Visit: Attending: Cardiology | Admitting: Cardiology

## 2024-10-13 VITALS — BP 140/72 | HR 74 | Ht 69.5 in | Wt 261.4 lb

## 2024-10-13 DIAGNOSIS — Z87891 Personal history of nicotine dependence: Secondary | ICD-10-CM

## 2024-10-13 DIAGNOSIS — E782 Mixed hyperlipidemia: Secondary | ICD-10-CM

## 2024-10-13 DIAGNOSIS — R931 Abnormal findings on diagnostic imaging of heart and coronary circulation: Secondary | ICD-10-CM

## 2024-10-13 DIAGNOSIS — R0609 Other forms of dyspnea: Secondary | ICD-10-CM | POA: Diagnosis not present

## 2024-10-13 DIAGNOSIS — E785 Hyperlipidemia, unspecified: Secondary | ICD-10-CM | POA: Diagnosis not present

## 2024-10-13 DIAGNOSIS — I480 Paroxysmal atrial fibrillation: Secondary | ICD-10-CM

## 2024-10-13 DIAGNOSIS — E669 Obesity, unspecified: Secondary | ICD-10-CM

## 2024-10-13 NOTE — Progress Notes (Signed)
 " Cardiology Office Note:    Date:  10/13/2024   ID:  Brittany Jensen, DOB 1966-04-18, MRN [id number]  PCP:  Erick Greig LABOR, NP  Cardiologist:  Jennifer JONELLE Crape, MD   Referring MD: Erick Greig LABOR, NP    ASSESSMENT:    1. Hyperlipidemia, unspecified hyperlipidemia type   2. Elevated coronary artery calcium  score   3. Paroxysmal atrial fibrillation (HCC)   4. Ex-smoker   5. Mixed dyslipidemia   6. Obesity (BMI 35.0-39.9 without comorbidity)    PLAN:    In order of problems listed above:  Coronary artery disease: Secondary prevention stressed with the patient.  Importance of compliance with diet medication stressed and patient verbalized standing.  He was advised to walk at least half an hour a day on a daily basis and she promises to do so. Paroxysmal atrial fibrillation:I discussed with the patient atrial fibrillation, disease process. Management and therapy including rate and rhythm control, anticoagulation benefits and potential risks were discussed extensively with the patient. Patient had multiple questions which were answered to patient's satisfaction. Mixed dyslipidemia: On lipid-lowering medications.  Lipids are not at goal she was advised to take medicine regularly and not every other day.  She will try to do this. Dyspnea on exertion: Will do exercise stress Cardiolite to rule out any ischemic substrate.  She has evidence of nonobstructive coronary artery disease in the past.  This will also help flecainide  management. Obesity: Significant weight gain in the past several months.  Diet emphasized.  Lifestyle modification urged and patient promises to do better. Patient will be seen in follow-up appointment in 6 months or earlier if the patient has any concerns.    Medication Adjustments/Labs and Tests Ordered: Current medicines are reviewed at length with the patient today.  Concerns regarding medicines are outlined above.  Orders Placed This Encounter  Procedures   EKG  12-Lead   No orders of the defined types were placed in this encounter.    No chief complaint on file.    History of Present Illness:    Brittany Jensen is a 59 y.o. female Patient has past medical history of essential hypertension, mixed dyslipidemia, paroxysmal fibrillation and morbid obesity.  She leads a sedentary lifestyle.  She has gained significant amount of weight in the past several months.  She tells me that she had injuries to both lower extremities and therefore could not exercise.  At the time of my evaluation, the patient is alert awake oriented and in no distress.  Past Medical History:  Diagnosis Date   Allergy    seasonal   Anxiety    Atrial fibrillation (HCC)    On medication for it.   Cigarette smoker 05/02/2017   Dyspnea on exertion 10/19/2021   Elevated coronary artery calcium  score 09/29/2023   Ex-smoker 08/17/2020   Family history of brain cancer    Family history of breast cancer    Family history of lung cancer    Family history of skin cancer    Genetic testing 05/30/2020   Negative genetic testing:  No pathogenic variants detected on the [facility] Common Hereditary Cancers Panel. The report date is 05/25/2020.   The Common Hereditary Cancers Panel offered by [facility] includes sequencing and/or deletion duplication testing of the following 48 genes: APC, ATM, AXIN2, BARD1, BMPR1A, BRCA1, BRCA2, BRIP1, CDH1, CDK4, CDKN2A (p14ARF), CDKN2A (p16INK4a), CHEK2, CTNNA1, DICER1,    GERD (gastroesophageal reflux disease)    Hyperlipidemia    Mixed dyslipidemia 08/17/2020  Obesity (BMI 30.0-34.9) 08/17/2020   Obesity (BMI 35.0-39.9 without comorbidity) 10/02/2022   Paroxysmal atrial fibrillation (HCC) 05/02/2017    Past Surgical History:  Procedure Laterality Date   APPENDECTOMY     C-section     CHOLECYSTECTOMY     WRIST SURGERY      Current Medications: Active Medications[1]   Allergies:   Ancef [cefazolin], Biaxin [clarithromycin], Penicillins,  Prednisone, and Wellbutrin [bupropion]   Social History   Socioeconomic History   Marital status: Married    Spouse name: Not on file   Number of children: Not on file   Years of education: Not on file   Highest education level: Associate degree: occupational, scientist, product/process development, or vocational program  Occupational History   Not on file  Tobacco Use   Smoking status: Former    Types: Cigarettes   Smokeless tobacco: Never  Vaping Use   Vaping status: Never Used  Substance and Sexual Activity   Alcohol use: Yes    Alcohol/week: 0.0 standard drinks of alcohol    Comment: occ   Drug use: No   Sexual activity: Not on file  Other Topics Concern   Not on file  Social History Narrative   Not on file   Social Drivers of Health   Tobacco Use: Medium Risk (10/13/2024)   Patient History    Smoking Tobacco Use: Former    Smokeless Tobacco Use: Never    Passive Exposure: Not on file  Financial Resource Strain: Low Risk (10/12/2024)   Overall Financial Resource Strain (CARDIA)    Difficulty of Paying Living Expenses: Not hard at all  Food Insecurity: No Food Insecurity (10/12/2024)   Epic    Worried About Radiation Protection Practitioner of Food in the Last Year: Never true    Ran Out of Food in the Last Year: Never true  Transportation Needs: No Transportation Needs (10/12/2024)   Epic    Lack of Transportation (Medical): No    Lack of Transportation (Non-Medical): No  Physical Activity: Insufficiently Active (10/12/2024)   Exercise Vital Sign    Days of Exercise per Week: 3 days    Minutes of Exercise per Session: 30 min  Stress: No Stress Concern Present (10/12/2024)   [employer] of Occupational Health - Occupational Stress Questionnaire    Feeling of Stress: Not at all  Social Connections: Moderately Isolated (10/12/2024)   Social Connection and Isolation Panel    Frequency of Communication with Friends and Family: More than three times a week    Frequency of Social Gatherings with Friends and  Family: Twice a week    Attends Religious Services: Never    Database Administrator or Organizations: No    Attends Engineer, Structural: Not on file    Marital Status: Married  Depression (EYV7-0): Not on file  Alcohol Screen: Low Risk (10/12/2024)   Alcohol Screen    Last Alcohol Screening Score (AUDIT): 2  Housing: Unknown (10/12/2024)   Epic    Unable to Pay for Housing in the Last Year: No    Number of Times Moved in the Last Year: Not on file    Homeless in the Last Year: No  Utilities: Not on file  Health Literacy: Not on file     Family History: The patient's family history includes Atrial fibrillation in her father; Brain cancer in her cousin; Breast cancer in her paternal grandmother and another family member; Breast cancer (age of onset: 23) in her mother; Cirrhosis in her paternal grandfather; Lung  cancer in her maternal grandfather; Squamous cell carcinoma in her father; Stroke in her paternal aunt. There is no history of Colon cancer, Colon polyps, Esophageal cancer, Rectal cancer, or Stomach cancer.  ROS:   Please see the history of present illness.    All other systems reviewed and are negative.  EKGs/Labs/Other Studies Reviewed:    The following studies were reviewed today: .SABRAEKG Interpretation Date/Time:  Wednesday October 13 2024 15:51:26 EST Ventricular Rate:  74 PR Interval:  138 QRS Duration:  88 QT Interval:  376 QTC Calculation: 417 R Axis:   80  Text Interpretation: Normal sinus rhythm Normal ECG When compared with ECG of 29-Nov-2023 07:11, PREVIOUS ECG IS PRESENT Confirmed by Edwyna Backers [phone]) on 10/13/2024 4:01:39 PM     Recent Labs: 11/29/2023: ALT 41; BUN 14; Creatinine, Ser 0.72; Hemoglobin 15.1; Platelets 256; Potassium 3.9; Sodium 142  Recent Lipid Panel    Component Value Date/Time   CHOL 133 04/28/2023 0817   TRIG 96 04/28/2023 0817   HDL 57 04/28/2023 0817   CHOLHDL 2.3 04/28/2023 0817   LDLCALC 58 04/28/2023 0817     Physical Exam:    VS:  BP (!) 140/72   Pulse 74   Ht 5' 9.5 (1.765 m)   Wt 261 lb 6 oz (118.6 kg)   LMP 02/13/2020   SpO2 96%   BMI 38.04 kg/m     Wt Readings from Last 3 Encounters:  10/13/24 261 lb 6 oz (118.6 kg)  11/29/23 224 lb (101.6 kg)  09/29/23 226 lb 3.2 oz (102.6 kg)     GEN: Patient is in no acute distress HEENT: Normal NECK: No JVD; No carotid bruits LYMPHATICS: No lymphadenopathy CARDIAC: Hear sounds regular, 2/6 systolic murmur at the apex. RESPIRATORY:  Clear to auscultation without rales, wheezing or rhonchi  ABDOMEN: Soft, non-tender, non-distended MUSCULOSKELETAL:  No edema; No deformity  SKIN: Warm and dry NEUROLOGIC:  Alert and oriented x 3 PSYCHIATRIC:  Normal affect   Signed, Backers JONELLE Edwyna, MD  10/13/2024 4:02 PM    [facility]     [1]  Current Meds  Medication Sig   albuterol (VENTOLIN HFA) 108 (90 Base) MCG/ACT inhaler Inhale 1-2 puffs into the lungs as needed.   ALPRAZolam (XANAX) 0.5 MG tablet Take 0.5 mg by mouth every 6 (six) hours as needed for anxiety.   aspirin  EC 81 MG tablet Take 1 tablet (81 mg total) by mouth daily. Swallow whole.   flecainide  (TAMBOCOR ) 50 MG tablet Take 1 tablet (50 mg total) by mouth 2 (two) times daily.   ibuprofen (ADVIL,MOTRIN) 800 MG tablet Take 800 mg by mouth every 8 (eight) hours as needed for headache.   montelukast (SINGULAIR) 10 MG tablet Take 1 tablet by mouth daily.   nitroGLYCERIN  (NITROSTAT ) 0.4 MG SL tablet Place 1 tablet (0.4 mg total) under the tongue every 5 (five) minutes as needed for chest pain.   omeprazole (PRILOSEC) 40 MG capsule Take 40 mg by mouth daily.   Pitavastatin  Calcium  2 MG TABS Take 1 tablet (2 mg total) by mouth daily.   semaglutide-weight management (WEGOVY) 1.5 MG tablet Take 1.5 mg by mouth daily.   Vitamin D, Ergocalciferol, (DRISDOL) 1.25 MG (50000 UNIT) CAPS capsule Take 50,000 Units by mouth once a week.   "

## 2024-10-13 NOTE — Patient Instructions (Signed)
 "         Medication Instructions:  Your physician recommends that you continue on your current medications as directed. Please refer to the Current Medication list given to you today.  *If you need a refill on your cardiac medications before your next appointment, please call your pharmacy*   Lab Work: None ordered If you have labs (blood work) drawn today and your tests are completely normal, you will receive your results only by: MyChart Message (if you have MyChart) OR A paper copy in the mail If you have any lab test that is abnormal or we need to change your treatment, we will call you to review the results.   Testing/Procedures: You are scheduled for a Myocardial Perfusion Imaging Study.  Please arrive 15 minutes prior to your appointment time for registration and insurance purposes.  The test will take approximately 3 to 4 hours to complete; you may bring reading material.  If someone comes with you to your appointment, they will need to remain in the main lobby due to limited space in the testing area.   How to prepare for your Myocardial Perfusion Test: Do not eat or drink 3 hours prior to your test, except you may have water. Do not consume products containing caffeine (regular or decaffeinated) 12 hours prior to your test. (ex: coffee, chocolate, sodas, tea). Do bring a list of your current medications with you.  If not listed below, you may take your medications as normal. Do wear comfortable clothes (no dresses or overalls) and walking shoes, tennis shoes preferred (No heels or open toe shoes are allowed). Do NOT wear cologne, perfume, aftershave, or lotions (deodorant is allowed). If these instructions are not followed, your test will have to be rescheduled.  If you cannot keep your appointment, please provide 24 hours notification to the Nuclear Lab, to avoid a possible $50 charge to your account.  Follow-Up: At [facility], you and your health needs are  our priority.  As part of our continuing mission to provide you with exceptional heart care, we have created designated Provider Care Teams.  These Care Teams include your primary Cardiologist (physician) and Advanced Practice Providers (APPs -  Physician Assistants and Nurse Practitioners) who all work together to provide you with the care you need, when you need it.  We recommend signing up for the patient portal called MyChart.  Sign up information is provided on this After Visit Summary.  MyChart is used to connect with patients for Virtual Visits (Telemedicine).  Patients are able to view lab/test results, encounter notes, upcoming appointments, etc.  Non-urgent messages can be sent to your provider as well.   To learn more about what you can do with MyChart, go to [email or web address].    Your next appointment:   9 week(s)  Provider:   Jennifer Crape, MD   Other Instructions  Cardiac Nuclear Scan A cardiac nuclear scan is a test that is done to check the flow of blood to your heart. It is done when you are resting and when you are exercising. The test looks for problems such as: Not enough blood reaching a portion of the heart. The heart muscle not working as it should. You may need this test if you have: Heart disease. Lab results that are not normal. Had heart surgery or a balloon procedure to open up blocked arteries (angioplasty) or a small mesh tube (stent). Chest pain. Shortness of breath. Had a heart attack. In this test, a  special dye (tracer) is put into your bloodstream. The tracer will travel to your heart. A camera will then take pictures of your heart to see how the tracer moves through your heart. This test is usually done at a hospital and takes 2-4 hours. Tell a doctor about: Any allergies you have. All medicines you are taking, including vitamins, herbs, eye drops, creams, and over-the-counter medicines. Any bleeding problems you have. Any surgeries you have  had. Any medical conditions you have. Whether you are pregnant or may be pregnant. Any history of asthma or long-term (chronic) lung disease. Any history of heart rhythm disorders or heart valve conditions. What are the risks? Your doctor will talk with you about risks. These may include: Serious chest pain and heart attack. This is only a risk if the stress portion of the test is done. Fast or uneven heartbeats (palpitations). A feeling of warmth in your chest. This feeling usually does not last long. Allergic reaction to the tracer. Shortness of breath or trouble breathing. What happens before the test? Ask your doctor about changing or stopping your normal medicines. Follow instructions from your doctor about what you cannot eat or drink. Remove your jewelry on the day of the test. Ask your doctor if you need to avoid nicotine  or caffeine. What happens during the test? An IV tube will be inserted into one of your veins. Your doctor will give you a small amount of tracer through the IV tube. You will wait for 20-40 minutes while the tracer moves through your bloodstream. Your heart will be monitored with an electrocardiogram (ECG). You will lie down on an exam table. Pictures of your heart will be taken for about 15-20 minutes. You may also have a stress test. For this test, one of these things may be done: You will be asked to exercise on a treadmill or a stationary bike. You will be given medicines that will make your heart work harder. This is done if you are unable to exercise. When blood flow to your heart has peaked, a tracer will again be given through the IV tube. After 20-40 minutes, you will get back on the exam table. More pictures will be taken of your heart. Depending on the tracer that is used, more pictures may need to be taken 3-4 hours later. Your IV tube will be removed when the test is over. The test may vary among doctors and hospitals. What happens after the  test? Ask your doctor: Whether you can return to your normal schedule, including diet, activities, travel, and medicines. Whether you should drink more fluids. This will help to remove the tracer from your body. Ask your doctor, or the department that is doing the test: When will my results be ready? How will I get my results? What are my treatment options? What other tests do I need? What are my next steps? This information is not intended to replace advice given to you by your health care provider. Make sure you discuss any questions you have with your health care provider. Document Revised: 01/29/2022 Document Reviewed: 01/29/2022 Elsevier Patient Education  2023 [employer].  "

## 2024-10-14 ENCOUNTER — Other Ambulatory Visit: Payer: Self-pay | Admitting: Cardiology

## 2024-10-14 DIAGNOSIS — I480 Paroxysmal atrial fibrillation: Secondary | ICD-10-CM

## 2024-10-20 ENCOUNTER — Ambulatory Visit

## 2024-11-03 ENCOUNTER — Ambulatory Visit
